# Patient Record
Sex: Female | Born: 1935 | Race: White | Hispanic: No | Smoking: Never smoker
Health system: Southern US, Community
[De-identification: ages and names within clinical notes are randomized; demographics above are authoritative.]

## PROBLEM LIST (undated history)

## (undated) DIAGNOSIS — I4891 Unspecified atrial fibrillation: Secondary | ICD-10-CM

## (undated) DIAGNOSIS — I10 Essential (primary) hypertension: Secondary | ICD-10-CM

## (undated) DIAGNOSIS — E039 Hypothyroidism, unspecified: Secondary | ICD-10-CM

## (undated) HISTORY — PX: ABDOMINAL HYSTERECTOMY: SHX81

## (undated) HISTORY — PX: CHOLECYSTECTOMY: SHX55

---

## 1997-12-14 ENCOUNTER — Other Ambulatory Visit: Admission: RE | Admit: 1997-12-14 | Discharge: 1997-12-14 | Payer: Self-pay | Admitting: Obstetrics and Gynecology

## 1998-12-23 ENCOUNTER — Other Ambulatory Visit: Admission: RE | Admit: 1998-12-23 | Discharge: 1998-12-23 | Payer: Self-pay | Admitting: Obstetrics and Gynecology

## 2000-05-14 ENCOUNTER — Other Ambulatory Visit: Admission: RE | Admit: 2000-05-14 | Discharge: 2000-05-14 | Payer: Self-pay | Admitting: Obstetrics and Gynecology

## 2009-07-01 ENCOUNTER — Ambulatory Visit: Payer: Self-pay | Admitting: Internal Medicine

## 2013-07-02 ENCOUNTER — Emergency Department: Payer: Self-pay | Admitting: Emergency Medicine

## 2013-07-02 LAB — COMPREHENSIVE METABOLIC PANEL
Albumin: 4.5 g/dL (ref 3.4–5.0)
Alkaline Phosphatase: 116 U/L
BUN: 17 mg/dL (ref 7–18)
Bilirubin,Total: 0.8 mg/dL (ref 0.2–1.0)
Creatinine: 0.76 mg/dL (ref 0.60–1.30)
EGFR (African American): >60
EGFR (Non-African Amer.): >60
Sodium: 135 mmol/L — ABNORMAL LOW (ref 136–145)
Total Protein: 8.3 g/dL — ABNORMAL HIGH (ref 6.4–8.2)

## 2013-07-02 LAB — TROPONIN I: Troponin-I: 0.03 ng/mL

## 2013-08-19 LAB — URINALYSIS, COMPLETE
Bacteria: NONE SEEN
Bilirubin,UR: NEGATIVE
GLUCOSE, UR: NEGATIVE mg/dL (ref 0–75)
Hyaline Cast: 3
Leukocyte Esterase: NEGATIVE
NITRITE: NEGATIVE
Ph: 6 (ref 4.5–8.0)
SPECIFIC GRAVITY: 1.01 (ref 1.003–1.030)
Squamous Epithelial: NONE SEEN
WBC UR: 1 /HPF (ref 0–5)

## 2013-08-19 LAB — COMPREHENSIVE METABOLIC PANEL
ALT: 35 U/L (ref 12–78)
Albumin: 3.6 g/dL (ref 3.4–5.0)
Alkaline Phosphatase: 85 U/L
Anion Gap: 9 (ref 7–16)
BILIRUBIN TOTAL: 0.9 mg/dL (ref 0.2–1.0)
BUN: 16 mg/dL (ref 7–18)
CHLORIDE: 96 mmol/L — AB (ref 98–107)
CO2: 25 mmol/L (ref 21–32)
CREATININE: 0.79 mg/dL (ref 0.60–1.30)
Calcium, Total: 8.7 mg/dL (ref 8.5–10.1)
EGFR (African American): >60
GLUCOSE: 153 mg/dL — AB (ref 65–99)
Osmolality: 265 (ref 275–301)
Potassium: 3.9 mmol/L (ref 3.5–5.1)
SGOT(AST): 60 U/L — ABNORMAL HIGH (ref 15–37)
Sodium: 130 mmol/L — ABNORMAL LOW (ref 136–145)
TOTAL PROTEIN: 6.9 g/dL (ref 6.4–8.2)

## 2013-08-19 LAB — CBC
HCT: 40.3 % (ref 35.0–47.0)
HGB: 13.7 g/dL (ref 12.0–16.0)
MCH: 30.6 pg (ref 26.0–34.0)
MCHC: 34 g/dL (ref 32.0–36.0)
MCV: 90 fL (ref 80–100)
Platelet: 125 10*3/uL — ABNORMAL LOW (ref 150–440)
RBC: 4.48 10*6/uL (ref 3.80–5.20)
RDW: 14.3 % (ref 11.5–14.5)
WBC: 7 10*3/uL (ref 3.6–11.0)

## 2013-08-19 LAB — T4, FREE: Free Thyroxine: 1.13 ng/dL (ref 0.76–1.46)

## 2013-08-19 LAB — CK: CK, Total: 1455 U/L — ABNORMAL HIGH

## 2013-08-19 LAB — LIPASE, BLOOD: LIPASE: 253 U/L (ref 73–393)

## 2013-08-19 LAB — MAGNESIUM: Magnesium: 2 mg/dL

## 2013-08-19 LAB — TROPONIN I
TROPONIN-I: 0.33 ng/mL — AB
Troponin-I: 0.95 ng/mL — ABNORMAL HIGH

## 2013-08-19 LAB — PROTIME-INR
INR: 1.1
Prothrombin Time: 14.3 secs (ref 11.5–14.7)

## 2013-08-19 LAB — CK-MB: CK-MB: 12.1 ng/mL — ABNORMAL HIGH (ref 0.5–3.6)

## 2013-08-19 LAB — CK TOTAL AND CKMB (NOT AT ARMC)
CK, TOTAL: 2143 U/L — AB
CK-MB: 15.2 ng/mL — AB (ref 0.5–3.6)

## 2013-08-20 ENCOUNTER — Inpatient Hospital Stay: Payer: Self-pay

## 2013-08-20 LAB — CBC WITH DIFFERENTIAL/PLATELET
BASOS PCT: 0.3 %
Basophil #: 0 10*3/uL (ref 0.0–0.1)
Eosinophil #: 0 10*3/uL (ref 0.0–0.7)
Eosinophil %: 0.1 %
HCT: 38.8 % (ref 35.0–47.0)
HGB: 13.4 g/dL (ref 12.0–16.0)
LYMPHS PCT: 10.8 %
Lymphocyte #: 0.6 10*3/uL — ABNORMAL LOW (ref 1.0–3.6)
MCH: 30.8 pg (ref 26.0–34.0)
MCHC: 34.5 g/dL (ref 32.0–36.0)
MCV: 89 fL (ref 80–100)
Monocyte #: 0.6 x10 3/mm (ref 0.2–0.9)
Monocyte %: 10.6 %
NEUTROS PCT: 78.2 %
Neutrophil #: 4.1 10*3/uL (ref 1.4–6.5)
PLATELETS: 131 10*3/uL — AB (ref 150–440)
RBC: 4.35 10*6/uL (ref 3.80–5.20)
RDW: 14.2 % (ref 11.5–14.5)
WBC: 5.3 10*3/uL (ref 3.6–11.0)

## 2013-08-20 LAB — LIPID PANEL
Cholesterol: 140 mg/dL (ref 0–200)
HDL: 46 mg/dL (ref 40–60)
Ldl Cholesterol, Calc: 78 mg/dL (ref 0–100)
Triglycerides: 79 mg/dL (ref 0–200)
VLDL Cholesterol, Calc: 16 mg/dL (ref 5–40)

## 2013-08-20 LAB — BASIC METABOLIC PANEL
Anion Gap: 5 — ABNORMAL LOW (ref 7–16)
BUN: 16 mg/dL (ref 7–18)
CHLORIDE: 100 mmol/L (ref 98–107)
CO2: 29 mmol/L (ref 21–32)
CREATININE: 0.88 mg/dL (ref 0.60–1.30)
Calcium, Total: 8.1 mg/dL — ABNORMAL LOW (ref 8.5–10.1)
EGFR (Non-African Amer.): 57 — ABNORMAL LOW
Glucose: 92 mg/dL (ref 65–99)
OSMOLALITY: 269 (ref 275–301)
POTASSIUM: 3.4 mmol/L — AB (ref 3.5–5.1)
Sodium: 134 mmol/L — ABNORMAL LOW (ref 136–145)

## 2013-08-20 LAB — MAGNESIUM: MAGNESIUM: 2 mg/dL

## 2013-08-20 LAB — HEMOGLOBIN A1C: Hemoglobin A1C: 5.9 % (ref 4.2–6.3)

## 2013-08-20 LAB — TROPONIN I
Troponin-I: 1.1 ng/mL — ABNORMAL HIGH
Troponin-I: 0.88 ng/mL — ABNORMAL HIGH

## 2013-08-20 LAB — CK: CK, TOTAL: 2249 U/L — AB

## 2013-08-20 LAB — CK TOTAL AND CKMB (NOT AT ARMC)
CK, Total: 2241 U/L — ABNORMAL HIGH
CK, Total: 2383 U/L — ABNORMAL HIGH
CK-MB: 12.9 ng/mL — AB (ref 0.5–3.6)
CK-MB: 9.2 ng/mL — ABNORMAL HIGH (ref 0.5–3.6)

## 2013-08-20 LAB — TSH: THYROID STIMULATING HORM: 2.02 u[IU]/mL

## 2013-08-21 ENCOUNTER — Encounter: Payer: Self-pay | Admitting: Internal Medicine

## 2013-08-21 LAB — BASIC METABOLIC PANEL
ANION GAP: 4 — AB (ref 7–16)
BUN: 13 mg/dL (ref 7–18)
CHLORIDE: 103 mmol/L (ref 98–107)
Calcium, Total: 8.6 mg/dL (ref 8.5–10.1)
Co2: 27 mmol/L (ref 21–32)
Creatinine: 0.86 mg/dL (ref 0.60–1.30)
GFR CALC NON AF AMER: 59 — AB
Glucose: 87 mg/dL (ref 65–99)
Osmolality: 268 (ref 275–301)
Potassium: 3.4 mmol/L — ABNORMAL LOW (ref 3.5–5.1)
Sodium: 134 mmol/L — ABNORMAL LOW (ref 136–145)

## 2013-08-21 LAB — CK: CK, Total: 2220 U/L — ABNORMAL HIGH

## 2013-08-25 LAB — URINALYSIS, COMPLETE
BLOOD: NEGATIVE
Bilirubin,UR: NEGATIVE
GLUCOSE, UR: NEGATIVE mg/dL (ref 0–75)
Ketone: NEGATIVE
Nitrite: NEGATIVE
PROTEIN: NEGATIVE
Ph: 7 (ref 4.5–8.0)
RBC,UR: <1 /HPF (ref 0–5)
SPECIFIC GRAVITY: 1.002 (ref 1.003–1.030)
Squamous Epithelial: <1

## 2013-08-27 LAB — URINE CULTURE

## 2013-08-31 ENCOUNTER — Encounter: Payer: Self-pay | Admitting: Internal Medicine

## 2013-10-09 ENCOUNTER — Inpatient Hospital Stay: Payer: Self-pay

## 2013-10-09 LAB — TROPONIN I
TROPONIN-I: 0.07 ng/mL — AB
Troponin-I: 0.06 ng/mL — ABNORMAL HIGH

## 2013-10-09 LAB — BASIC METABOLIC PANEL
Anion Gap: 6 — ABNORMAL LOW (ref 7–16)
BUN: 23 mg/dL — ABNORMAL HIGH (ref 7–18)
CALCIUM: 8.8 mg/dL (ref 8.5–10.1)
CHLORIDE: 103 mmol/L (ref 98–107)
Co2: 27 mmol/L (ref 21–32)
Creatinine: 0.94 mg/dL (ref 0.60–1.30)
EGFR (African American): >60
EGFR (Non-African Amer.): 53 — ABNORMAL LOW
GLUCOSE: 101 mg/dL — AB (ref 65–99)
Osmolality: 276 (ref 275–301)
Potassium: 4.3 mmol/L (ref 3.5–5.1)
SODIUM: 136 mmol/L (ref 136–145)

## 2013-10-09 LAB — CBC
HCT: 42.3 % (ref 35.0–47.0)
HGB: 13.8 g/dL (ref 12.0–16.0)
MCH: 29.3 pg (ref 26.0–34.0)
MCHC: 32.7 g/dL (ref 32.0–36.0)
MCV: 90 fL (ref 80–100)
Platelet: 197 10*3/uL (ref 150–440)
RBC: 4.72 10*6/uL (ref 3.80–5.20)
RDW: 14.5 % (ref 11.5–14.5)
WBC: 4.7 10*3/uL (ref 3.6–11.0)

## 2013-10-09 LAB — PRO B NATRIURETIC PEPTIDE: B-TYPE NATIURETIC PEPTID: 5108 pg/mL — AB (ref 0–450)

## 2013-10-10 LAB — TSH: Thyroid Stimulating Horm: 4.99 u[IU]/mL — ABNORMAL HIGH

## 2013-10-10 LAB — CBC WITH DIFFERENTIAL/PLATELET
BASOS PCT: 0.4 %
Basophil #: 0 10*3/uL (ref 0.0–0.1)
EOS PCT: 1 %
Eosinophil #: 0 10*3/uL (ref 0.0–0.7)
HCT: 40.4 % (ref 35.0–47.0)
HGB: 12.9 g/dL (ref 12.0–16.0)
Lymphocyte #: 1.1 10*3/uL (ref 1.0–3.6)
Lymphocyte %: 21.4 %
MCH: 28.8 pg (ref 26.0–34.0)
MCHC: 31.9 g/dL — AB (ref 32.0–36.0)
MCV: 90 fL (ref 80–100)
MONO ABS: 0.5 x10 3/mm (ref 0.2–0.9)
MONOS PCT: 9.8 %
NEUTROS ABS: 3.4 10*3/uL (ref 1.4–6.5)
NEUTROS PCT: 67.4 %
Platelet: 177 10*3/uL (ref 150–440)
RBC: 4.48 10*6/uL (ref 3.80–5.20)
RDW: 14 % (ref 11.5–14.5)
WBC: 5 10*3/uL (ref 3.6–11.0)

## 2013-10-10 LAB — BASIC METABOLIC PANEL
Anion Gap: 6 — ABNORMAL LOW (ref 7–16)
BUN: 20 mg/dL — AB (ref 7–18)
Calcium, Total: 8.3 mg/dL — ABNORMAL LOW (ref 8.5–10.1)
Chloride: 108 mmol/L — ABNORMAL HIGH (ref 98–107)
Co2: 27 mmol/L (ref 21–32)
Creatinine: 0.73 mg/dL (ref 0.60–1.30)
EGFR (Non-African Amer.): >60
GLUCOSE: 83 mg/dL (ref 65–99)
Osmolality: 283 (ref 275–301)
Potassium: 3.8 mmol/L (ref 3.5–5.1)
SODIUM: 141 mmol/L (ref 136–145)

## 2013-10-10 LAB — TROPONIN I: Troponin-I: 0.06 ng/mL — ABNORMAL HIGH

## 2013-11-21 ENCOUNTER — Emergency Department: Payer: Self-pay | Admitting: Emergency Medicine

## 2013-11-21 LAB — BASIC METABOLIC PANEL
ANION GAP: 12 (ref 7–16)
BUN: 18 mg/dL (ref 7–18)
CALCIUM: 8.9 mg/dL (ref 8.5–10.1)
CHLORIDE: 101 mmol/L (ref 98–107)
CREATININE: 1.05 mg/dL (ref 0.60–1.30)
Co2: 24 mmol/L (ref 21–32)
GFR CALC AF AMER: 53 — AB
GFR CALC NON AF AMER: 46 — AB
Glucose: 225 mg/dL — ABNORMAL HIGH (ref 65–99)
Osmolality: 283 (ref 275–301)
Potassium: 3.9 mmol/L (ref 3.5–5.1)
Sodium: 137 mmol/L (ref 136–145)

## 2013-11-21 LAB — CBC
HCT: 42.6 % (ref 35.0–47.0)
HGB: 14.1 g/dL (ref 12.0–16.0)
MCH: 29.7 pg (ref 26.0–34.0)
MCHC: 33 g/dL (ref 32.0–36.0)
MCV: 90 fL (ref 80–100)
Platelet: 200 10*3/uL (ref 150–440)
RBC: 4.73 10*6/uL (ref 3.80–5.20)
RDW: 13.7 % (ref 11.5–14.5)
WBC: 5.5 10*3/uL (ref 3.6–11.0)

## 2013-11-21 LAB — TROPONIN I: TROPONIN-I: 0.03 ng/mL

## 2014-10-24 NOTE — Consult Note (Signed)
PATIENT NAME:  Zoe Hughes, Zoe Hughes MR#:  [id number] DATE OF BIRTH:  April 01, 1919  DATE OF CONSULTATION:  08/20/2013  CONSULTING PHYSICIAN:  Marcina MillardAlexander Jamaine Quintin, MD  PRIMARY CARE PHYSICIAN: Dr. Sampson GoonFitzgerald.   CHIEF COMPLAINT: "I fell."   REASON FOR CONSULTATION: Consultation requested for evaluation of borderline elevated troponin and atrial fibrillation.   HISTORY OF PRESENT ILLNESS: The patient is a 81 year old female with history of chronic atrial fibrillation and hypertension. The patient apparently was in her usual state of health. Denies chest pain or shortness of breath. The patient ate a light breakfast. She was trying to fix lunch when she fell to the floor and experienced generalized weakness. The patient reports that she was on the floor for three hours struggling to get on her feet and finally called 911. The patient was [facility] where she was noted to be in atrial fibrillation with a rapid ventricular rate in the 120s to 140s treated with intravenous diltiazem. The patient was admitted to telemetry, where admission labs were notable for a borderline elevated troponin of 0.95. Of note, total CK and MB fraction were 1455 and 12.1, respectively. Today, the patient reports doing well. Denies chest pain or shortness of breath.   PAST MEDICAL HISTORY: 1. Atrial fibrillation.  2. Hypertension.  3. Hypothyroidism.   MEDICATIONS: Diltiazem 180 mg daily, Synthroid 50 mcg daily.   SOCIAL HISTORY: The patient currently lives alone. She denies tobacco or EtOH abuse.   FAMILY HISTORY: Mother status post CVA.   REVIEW OF SYSTEMS.  CONSTITUTIONAL: No fever or chills.  EYES: No blurry vision.   EARS: No hearing loss.  RESPIRATORY: No shortness of breath.  CARDIOVASCULAR: No chest pain.  GASTROINTESTINAL: No nausea, vomiting, the patient has had some chronic diarrhea.  GENITOURINARY: No dysuria or hematuria.  ENDOCRINE: No polyuria or polydipsia.  MUSCULOSKELETAL: No  arthralgias or myalgias.  NEUROLOGICAL: No focal muscle weakness or numbness.  PSYCHOLOGICAL: No depression or anxiety.   PHYSICAL EXAMINATION: VITAL SIGNS: Blood pressure 118/57, pulse 79, respirations 18, temperature 98.5, pulse oximetry 89%.  HEENT: Pupils equal, reactive to light and accommodation.  NECK: Supple without thyromegaly.  LUNGS: Clear.  HEART: Normal JVP. Normal PMI. Regular rate and rhythm. Normal S1, S2. No appreciable gallop, murmur, or rub.  ABDOMEN: Soft and nontender. Pulses were intact bilaterally.  MUSCULOSKELETAL: Normal muscle tone.  NEUROLOGIC: The patient is alert and oriented x 3. Motor and sensory both grossly intact.   IMPRESSION: A 81 year old female who fell, struggled to get on her feet for over three hours, has borderline elevated troponin very likely due to demand supply ischemia, especially in light of atrial fibrillation with rapid ventricular rate, also exacerbated by the patient's clinical presentation as described above. The patient currently appears clinically stable. Heart rate under good control. The patient denies chest pain.   RECOMMENDATIONS: 1. Agree with overall current therapy.  2. Defer full dose anticoagulation.  3. Would certainly defer chronic anticoagulation for atrial fibrillation despite CHADS2 score of 2 in light of the patient's recent fall.  4. Defer further noninvasive or invasive cardiac evaluation at this time.  ____________________________ Marcina MillardAlexander Tiffeny Minchew, MD ap:sg D: 08/20/2013 09:44:51 ET T: 08/20/2013 10:31:17 ET JOB#: [id number]  cc: Marcina MillardAlexander Aixa Corsello, MD, <Dictator> Marcina MillardALEXANDER Chad Donoghue MD ELECTRONICALLY SIGNED 09/05/2013 13:15

## 2014-10-24 NOTE — Discharge Summary (Signed)
PATIENT NAME:  Zoe Hughes, Zoe Hughes MR#:  [id number] DATE OF BIRTH:  [birth year]  DATE OF ADMISSION:  10/09/2013 DATE OF DISCHARGE:  10/11/2013  DISCHARGE DIAGNOSES: 1.  Atrial fibrillation with rapid ventricular response.  2.  Bronchitis.   HISTORY OF PRESENT ILLNESS:  Please see initial history and physical.  Briefly, this is a 81 year old female with history of known A. Fib.  She came in with a cough as well as tachycardia.  She was sent in because at home her heart rate was in the 150s.  The patient denied any symptoms.  The patient was found to be in A. Fib with rapid ventricular response.   HOSPITAL COURSE BY ISSUE:  1.  A. Fib with RVR.  She converted with IV diltiazem.  She was already on Cardizem 120, but we have increased that to 180.  Heart rates remained stable, in normal sinus rhythm.  She was seen by Dr. Gwen PoundsKowalski.  She will be discharged off anticoagulation given fall risk.  2.  Bronchitis.  She will finish off the levofloxacin that was started at home.   HOSPITAL FOLLOW-UP:  The patient will follow up with Dr. Sampson GoonFitzgerald in 1 to 2 weeks.   DISCHARGE MEDICATIONS:  Please see [facility] discharge instructions.  Briefly, she will swap out her Cardizem 120 to Cardizem 180.   DISCHARGE DIET:  Low sodium, regular consistency.   DISCHARGE CODE STATUS:  THE PATIENT IS DO NOT RESUSCITATE.   TIME SPENT:  This discharge took 35 minutes.    ____________________________ Stann Mainlandavid P. Sampson GoonFitzgerald, MD dpf:ea D: 10/11/2013 12:01:02 ET T: 10/11/2013 16:02:31 ET JOB#: [id number]  cc: Stann Mainlandavid P. Sampson GoonFitzgerald, MD, <Dictator> Jasani Lengel Sampson GoonFITZGERALD MD ELECTRONICALLY SIGNED 10/14/2013 22:15

## 2014-10-24 NOTE — Discharge Summary (Signed)
PATIENT NAME:  Zoe Hughes, Zoe Hughes MR#:  [id number] DATE OF BIRTH:  January 08, 1919  DATE OF ADMISSION:  08/20/2013 DATE OF DISCHARGE:  08/21/2013  DISCHARGE DIAGNOSES: 1.  Rhabdomyolysis due to fall.  2.  Atrial fib with rapid ventricular response.  3.  Positive troponins, likely supply/demand ischemia.   HISTORY OF PRESENT ILLNESS: This is a very pleasant 81 year old female who lives alone. She ended up having a fall at home and was on the ground for several hours. She was scooting along the floor to try to get to the phone. She was able to call 911 after 3 hours. She was brought to the ER where she was found to be in Afib with RVR. She also had elevated CPK consistent with rhabdo.   HOSPITAL COURSE:  By issue: 1.  Atrial fib with rapid ventricular response. She was given IV dilt in the ER and oral diltiazem and came under rate control. She was maintained on dilt 60 q.6 throughout the hospitalization. On the day of discharge, her heart rate was again noted to be increased. She was increased from 240 to 300 and will be discharged on that dose.  2. Rhabdo CK peaked at 2383. Treated with IV fluids and renal function remained stable. On the day of discharge, creatinine was 0.86. Potassium level was normal.    3.  Diarrhea. She had several episodes of diarrhea, but this resolved. No C. diff was checked as she could no longer provide a sample.  4.  Positive troponins. The patient was seen by Dr. Darrold JunkerParaschos, who felt this was likely supply/demand ischemia with the Afib with RVR. He did not recommend any further cardiac workup. He did not recommend anticoagulation for the Afib given the falls risk.   DISCHARGE MEDICATIONS: Please see [facility] discharge summary; however, she will continue:  1.  Synthroid 50.  2.  Diltiazem 300 once a day.  3.  Acetaminophen 325 q.4h. p.r.n. pain or fever.  4.  Simvastatin 10 mg once a day.   DISCHARGE DISPOSITION: She will be discharged to a skilled nursing facility.   DISCHARGE  DIET: Low sodium, regular consistency.   ACTIVITY: As tolerated.   FOLLOWUP: The patient will follow up with Dr. Sampson GoonFitzgerald or Dr. Dareen PianoAnderson, [facility], within 1 to 2 weeks. She will need a followup renal function panel.   This discharge took 35 minutes.   ____________________________ Stann Mainlandavid P. Sampson GoonFitzgerald, MD dpf:dmm D: 08/21/2013 09:39:49 ET T: 08/21/2013 10:00:49 ET JOB#: [id number]  cc: Stann Mainlandavid P. Sampson GoonFitzgerald, MD, <Dictator> Tait Balistreri Sampson GoonFITZGERALD MD ELECTRONICALLY SIGNED 08/27/2013 23:23

## 2014-10-24 NOTE — H&P (Signed)
PATIENT NAME:  Zoe Hughes, Zoe Hughes MR#:  [id number] DATE OF BIRTH:  10/15/1918  DATE OF ADMISSION:  08/19/2013  PRIMARY CARE PHYSICIAN: Stann Mainlandavid P. Sampson GoonFitzgerald, MD, at [facility].   REFERRING PHYSICIAN: Eartha InchCory R. York CeriseForbach, MD  CHIEF COMPLAINT: Status post fall.   HISTORY OF PRESENT ILLNESS: The patient is a pleasant 81 year old female with a history of chronic A. fib, hypothyroidism, hypertension, who had a fall about 2:00 a.m. Apparently, she was trying to fix lunch, turned around to sit down and had a fall without loss of consciousness. She fell on her tailbone and states that she felt weak and was on the floor crawling to go to a phone but could not and it took her 3 hours and finally did call EMS, who brought her here. Here, she was noted to have a positive troponin of 0.33 without any chest pain and CK-MB of 12. She was also noted to have A. fib with RVR with rates of 120s to 140s and received diltiazem IV 15 mg x 1 as well as 120 mg p.o. The rates are now currently in the 90s to low 100s and hospitalist services were contacted for further evaluation and management.   PAST MEDICAL HISTORY: A. fib, hypothyroidism, hypertension.   PAST SURGICAL HISTORY: Cholecystectomy and hysterectomy. Neck surgery for tumor removal and rectocele surgery.   ALLERGIES: ASPIRIN CAUSING BLEEDING AND ALLERGIC TO TETANUS SHOT.   OUTPATIENT MEDICATIONS: Diltiazem 180 mg extended-release daily and Synthroid 50 mcg daily.   FAMILY HISTORY: Mom stroke, dad's side diabetes.   SOCIAL HISTORY: No tobacco, alcohol or drug use. Lives by herself. Does her  own ADLs.    REVIEW OF SYSTEMS: CONSTITUTIONAL: No fever, fatigue or weakness or weight gain. The patient has about 5 to 6-pound weight loss in the last 6 months.  EYES: No blurry vision or double vision.  ENT:  No tinnitus or hearing loss.  RESPIRATORY: No cough, wheezing, shortness of breath or dyspnea on exertion.  CARDIOVASCULAR: No chest pain or palpitations. No  swelling in the legs CHF.  Has hypertension and A. fib.  GASTROINTESTINAL: No nausea, vomiting. Has bouts of diarrhea and constipation over the last couple of months and being treated by PCP for that. The patient had diarrhea over 2 days in the last week, none today. No bloody stools or dark stools.  GENITOURINARY:  Denies dysuria or hematuria.  HEMATOLOGY AND LYMPHATIC:  No anemia or easy bruising.  SKIN: No rashes.  MUSCULOSKELETAL: Has some pain in the tailbone area.  NEUROLOGIC: No focal weakness or numbness.  PSYCHIATRIC: No anxiety or depression.   PHYSICAL EXAMINATION: VITAL SIGNS: Temperature 98.2, pulse rate 88, respiratory rate 20, blood pressure 147/60, O2 sat 95% on room air.  GENERAL: The patient is a well-developed female lying in bed, no obvious distress.  HEENT: Normocephalic, atraumatic. Pupils are equal and reactive. Anicteric sclerae. Extraocular muscles intact. Moist mucous membranes.  NECK: Supple. No thyroid tenderness. No cervical lymphadenopathy.  CARDIOVASCULAR: S1, S2, irregularly irregular. No significant murmurs appreciated.  LUNGS: Clear to auscultation without wheezing, rhonchi or rales.  ABDOMEN: Soft, nontender, nondistended. Positive bowel sounds in all quadrants.  EXTREMITIES: No pitting edema.  NEUROLOGIC: Cranial nerves II through XII grossly intact. Strength is 5 out of 5 in all extremities. Sensation is intact to light touch.  PSYCHIATRIC: Awake, alert and oriented x 3, cooperative.   LABORATORY, DIAGNOSTIC AND RADIOLOGICAL DATA:  Sodium 130, potassium 3.9, serum CO2 25. LFTs show AST of 60, otherwise within normal limits.  CK-MB of 12.1, CK total is pending. Troponin 0.33.  Free thyroxine is 1.13. White count is 7, hemoglobin 13.7, platelets 125. UA not suggestive of infection. EKG shows A. fib with RVR, rate of 102, no previous EKG here. There is no acute ST elevations or depressions, perhaps a little bit of dynamic T wave changes in V4, V5. X-ray of the  chest no acute abnormality. X-ray of the left hip, complete, showing no evidence for a left hip fracture.   ASSESSMENT AND PLAN: We have a 81 year old female with chronic atrial fibrillation, hypertension, hypothyroidism, status post fall and was on the floor for 3  hours who comes in with atrial fibrillation with rapid ventricular response and positive troponin. The patient has no active pains in the chest. The patient also had atrial fibrillation with rapid ventricular response now better with IV diltiazem in addition to some p.o. diltiazem. Would admit the patient to telemetry for observation and start the patient on 60 mg every 6 hours of p.o. diltiazem. In regards to positive troponin, she has no active chest pains. It is possible that this is non-ST elevation myocardial infarction but it is also possible that this is more of a demand ischemia secondary to atrial fibrillation with rapid ventricular response. We have no previous EKGs to compare this with. Would cycle the troponins.  The patient cannot tolerate aspirin and is not on  any blood thinners or antiplatelets for the atrial fibrillation. Would start a statin, obtain an echocardiogram and obtain cardiology consult for tomorrow. Will start the patient on some gentle fluids and control the heart rate. Would wait for the CK total as the patient might have a little bit of a rhabdomyolysis as well. The patient has received some fluids in the ER. Would start on heparin for deep vein thrombosis prophylaxis and check a TSH and resume Synthroid. CODE STATUS: The patient is DO NOT RESUSCITATE per her wishes.   TOTAL TIME SPENT: Is 45 minutes.    ____________________________ Krystal Eaton, MD sa:cs D: 08/19/2013 20:21:43 ET T: 08/19/2013 20:36:11 ET JOB#: [id number]  cc: Krystal Eaton, MD, <Dictator> Stann Mainland. Sampson Goon, MD Marcelle Smiling [facility] MD ELECTRONICALLY SIGNED 09/05/2013 13:08

## 2014-10-24 NOTE — H&P (Signed)
PATIENT NAME:  Zoe Hughes, Zoe Hughes MR#:  [id number] DATE OF BIRTH:  [birth year]  DATE OF ADMISSION:  10/09/2013  PRIMARY CARE PHYSICIAN:  Dr. Sampson Goon.  CHIEF COMPLAINT: Told to come in for fast heart rate.   HISTORY OF PRESENT ILLNESS: This is a 81 year old female who stated that her physical therapist came out to see her and noted her heart rate was fast. The patient called up Dr. Sampson Goon this morning secondary to her coughing up some bloody mucus. She stopped taking her nose drops for her nosebleed. Dr. Sampson Goon called in some Levaquin and she took her first dose today. Her physical therapist came back to the house today and noticed her heart rate was up, and she was told to come in by Dr. Jarrett Ables nurse to get evaluated for the fast heart rate. In the ER, her heart rate has been in the 150s. Initially looked like SVT, but now atrial fibrillation. Her blood pressure has been good. The patient has a history of atrial fibrillation in the past and is on Cardizem CD 120 mg daily. The patient feels okay. Does not complain of any chest pain or shortness of breath or even palpitations.   PAST MEDICAL HISTORY: Atrial fibrillation, hypothyroidism, hypertension, glaucoma, arthritis.   PAST SURGICAL HISTORY: Cholecystectomy, hysterectomy, bladder tuck, cataracts, neck surgery and rectocele surgery as per old chart.   ALLERGIES: ASPIRIN CAUSES BLEEDING. ALLERGIC TO TETANUS SHOT.   FAMILY HISTORY: Father died of lung cancer. Mother died of a CVA.   SOCIAL HISTORY: Currently lives at [facility] assisted living. No smoking. No alcohol. No drug use. Worked in the [employer].   MEDICATIONS: Include diltiazem extended-release 120 mg daily, Levaquin 500 mg daily  starting today, Synthroid 50 mcg daily.   REVIEW OF SYSTEMS:  CONSTITUTIONAL: Positive for hot feeling. No fever or chills. Positive for weight loss from 117 down to 105. No weakness or fatigue.  EYES: She did have glaucoma and also cataract  in the past.  EARS, NOSE, MOUTH AND THROAT: Decreased hearing. Positive for bloody nose. Positive for runny nose.  CARDIOVASCULAR: No chest pain or palpitations.  RESPIRATORY: No shortness of breath. Positive for coughing. No real phlegm.  GASTROINTESTINAL: No nausea. No vomiting. No abdominal pain, alternating diarrhea and constipation. No bright red blood per rectum. No melena.  GENITOURINARY: No burning on urination. No hematuria.  MUSCULOSKELETAL: Positive for low back pain.  INTEGUMENT: No rashes or eruptions.  NEUROLOGIC: No fainting or blackouts.  PSYCHIATRIC: No anxiety or depression.  ENDOCRINE: Positive for hypothyroidism.  HEMATOLOGIC AND LYMPHATIC: No anemia and no easy bruising or bleeding.   PHYSICAL EXAMINATION: VITAL SIGNS: Temperature 98, pulse 154, respirations 20, blood pressure 131/99, pulse ox 100% on room air.  GENERAL: No respiratory distress.  EYES: Conjunctivae and lids normal. Pupils equal, round, and reactive to light. Extraocular muscles intact. No nystagmus.  EARS, NOSE, MOUTH AND THROAT: Tympanic membranes: No erythema. Nasal mucosa: Slight erythema. No exudate seen. Lips and gums: No lesions. Throat:  No erythema.  NECK: No JVD. No bruits. No thyromegaly. No thyroid nodules palpated.  RESPIRATORY: Lungs: Clear to auscultation. No use of accessory muscles to breathe. No rhonchi, rales or wheeze heard.  CARDIOVASCULAR: S1, S2, irregularly irregular, tachycardic, 2/6 systolic ejection murmur. Carotid upstroke 2+ bilaterally. No bruits. Dorsalis pedis pulses 2+ bilaterally. No edema of the lower extremity.  ABDOMEN: Soft, nontender. No organomegaly/splenomegaly. Normoactive bowel sounds. No masses felt.  LYMPHATIC: No lymph nodes in the neck.  MUSCULOSKELETAL: No clubbing, edema  or cyanosis.  SKIN: No rashes or lesions seen.  NEUROLOGIC: Cranial nerves II through XII grossly intact. Deep tendon reflexes 2+ bilateral lower extremities.  PSYCHIATRIC: The patient is  oriented to person, place and time.   LABORATORY AND RADIOLOGICAL DATA: Previous EKG, 08/20/2013, showed an EF of 60% to 65%, LVH, mild mitral valve regurgitation. Labs for this hospital stay: Chest x-ray showed stable cardiomegaly. No active acute cardiopulmonary disease. Troponin borderline at 0.06. White blood cell count 4.7, H and H 13.8 and 42.3, platelet count of 197. Glucose 101, BUN 23, creatinine 0.94, sodium 136, potassium 4.3, chloride 103, CO2 of 27, calcium 8.8. BNP 5108.   ASSESSMENT AND PLAN: 1.  Rapid atrial fibrillation. Will give gentle IV fluid hydration. The patient is on Cardizem drip 15 mg per hour. We will also add IV metoprolol 5 mg IV q.1 hour for fast heart rate. We will start oral metoprolol 25 mg b.i.d. with the first dose stat. Since the patient recently had an echocardiogram, will not repeat. We will get a cardiology consultation in the a.m.  We will give gentle IV fluid hydration since the patient has an elevated BUN. The patient has no signs of congestive heart failure at this time, even though BNP is elevated. Chest x-ray is clear and lungs are clear.  2.  Elevated troponin, likely secondary to fast heart rate. THE PATIENT HAS AN ALLERGY TO ASPIRIN.  3.   Sinusitis, recently started on Levaquin by Dr. Sampson GoonFitzgerald, will continue.  4.  Hypothyroidism. Continue levothyroxine and check a TSH in the a.m.   TIME SPENT ON ADMISSION: 55 minutes.   I still will clarify code status on this patient. She needed to go to the bathroom prior to me finishing my talking with her.    ____________________________ Herschell Dimesichard J. Renae GlossWieting, MD rjw:dmm D: 10/09/2013 19:01:00 ET T: 10/09/2013 19:14:42 ET JOB#: [id number]  cc: Onalee Huaavid P. Sampson GoonFitzgerald, MD Herschell Dimesichard J. Renae GlossWieting, MD, <Dictator> Salley ScarletICHARD J Carlyn Lemke MD ELECTRONICALLY SIGNED 10/12/2013 19:17

## 2014-10-24 NOTE — Consult Note (Signed)
PATIENT NAME:  Zoe BillingsCLARK, Deneshia B MR#:  [id number] DATE OF BIRTH:  105/30/1920  DATE OF CONSULTATION:  10/10/2013  REFERRING PHYSICIAN:  Ramonita LabAruna Gouru, MD CONSULTING PHYSICIAN:  Lamar BlinksBruce J. Alyshia Kernan, MD  REASON FOR CONSULTATION: Atrial fibrillation with rapid ventricular rate, rheumatic fever and hypertension.   CHIEF COMPLAINT: "I was short of breath."   HISTORY OF PRESENT ILLNESS: This is a 81 year old female with known falling who fell 4 weeks prior for unknown reasons, but did not have any evidence of causes or syncope. The patient has had some hypertension, on appropriate medication management, and has had a recent bronchitis. Wit this bronchitis, the patient has had some new onset atrial fibrillation with rapid ventricular rate and now converted to normal sinus rhythm on appropriate medication management. The patient has had no evidence of bleeding complication, but does have pre-ventricular and pre-atrial contractions by EKG today. The patient does have a little elevation of troponin of 0.07 consistent with demand ischemia.   REVIEW OF SYSTEMS: The remainder review of systems is negative for vision change, ringing in the ears, hearing loss, cough, congestion, heartburn, nausea, vomiting, diarrhea, bloody stool, stomach pain, extremity pain, leg weakness, cramping in buttocks, known blood clots, headaches, blackouts, dizzy spells, nosebleeds, frequent urination, urination at night, muscle weakness, numbness, anxiety, depression, skin lesions or skin rashes.   PAST MEDICAL HISTORY: 1.  Hypertension.  2.  Preventricular contractions.  3.  Rheumatic fever.  FAMILY HISTORY: No family members with early onset of cardiovascular disease or hypertension.   SOCIAL HISTORY: Currently denies alcohol or tobacco use.   ALLERGIES: As listed.   MEDICATIONS: As listed.   PHYSICAL EXAMINATION: VITAL SIGNS: Blood pressure is 122/68 bilaterally and heart rate 70 upright and reclining and irregular.  GENERAL:  She is a well-appearing female in no acute distress.  HEENT: No icterus, thyromegaly, ulcers, hemorrhage, or xanthelasma.  CARDIOVASCULAR: Irregularly irregular with normal S1 and S2. A 2/6 apical murmur consistent with mitral regurgitation. PMI is diffuse. Carotid upstroke normal without bruit. Jugular venous pressure is normal.  LUNGS: Few basilar crackles and few expiratory wheezes.  ABDOMEN: Soft and nontender without hepatosplenomegaly or masses. Abdominal aorta is normal size without bruit.  EXTREMITIES: 2+ radial, femoral, and dorsal pedal pulses with no lower extremity edema, cyanosis, clubbing or ulcers.  NEUROLOGIC: She is oriented to time, place, and person with normal mood and affect.  ASSESSMENT: A 81 year old female with hypertension and bronchitis type symptoms with new onset atrial fibrillation with rapid ventricular rate now converted to normal rhythm with preventricular contractions and no current evidence of myocardial infarction and/or congestive heart failure.   RECOMMENDATIONS: 1.  Continue heart rate control with goal heart rate between 60 and 90 beats per minute.  2.  Consideration of anticoagulation, although would use aspirin at this time due to advanced age and isolated episode of atrial fibrillation to reduce bleeding complications and concerns with the patient recently having lots of fall risk.  3.  Echocardiogram for history of rheumatic fever, causes of atrial fibrillation. 4.  Ambulation and follow for further needs in adjustments of medication management.   ____________________________ Lamar BlinksBruce J. Demontez Novack, MD bjk:sb D: 10/10/2013 08:36:43 ET T: 10/10/2013 08:45:17 ET JOB#: [id number]  cc: Lamar BlinksBruce J. Aalayah Riles, MD, <Dictator> Lamar BlinksBRUCE J Ariday Brinker MD ELECTRONICALLY SIGNED 10/14/2013 7:49

## 2015-08-22 ENCOUNTER — Inpatient Hospital Stay
Admission: EM | Admit: 2015-08-22 | Discharge: 2015-08-25 | DRG: 640 | Disposition: A | Discharge status: Skilled Nursing Facility | Payer: Medicare Other | Attending: Internal Medicine | Admitting: Internal Medicine

## 2015-08-22 ENCOUNTER — Encounter: Payer: Self-pay | Admitting: Emergency Medicine

## 2015-08-22 DIAGNOSIS — E871 Hypo-osmolality and hyponatremia: Principal | ICD-10-CM | POA: Diagnosis present

## 2015-08-22 DIAGNOSIS — Z9049 Acquired absence of other specified parts of digestive tract: Secondary | ICD-10-CM | POA: Diagnosis not present

## 2015-08-22 DIAGNOSIS — I482 Chronic atrial fibrillation: Secondary | ICD-10-CM | POA: Diagnosis present

## 2015-08-22 DIAGNOSIS — Z887 Allergy status to serum and vaccine status: Secondary | ICD-10-CM | POA: Diagnosis not present

## 2015-08-22 DIAGNOSIS — R0602 Shortness of breath: Secondary | ICD-10-CM

## 2015-08-22 DIAGNOSIS — Z79899 Other long term (current) drug therapy: Secondary | ICD-10-CM

## 2015-08-22 DIAGNOSIS — Z888 Allergy status to other drugs, medicaments and biological substances status: Secondary | ICD-10-CM

## 2015-08-22 DIAGNOSIS — Z90710 Acquired absence of both cervix and uterus: Secondary | ICD-10-CM

## 2015-08-22 DIAGNOSIS — E860 Dehydration: Secondary | ICD-10-CM | POA: Diagnosis present

## 2015-08-22 DIAGNOSIS — J45909 Unspecified asthma, uncomplicated: Secondary | ICD-10-CM | POA: Diagnosis present

## 2015-08-22 DIAGNOSIS — J189 Pneumonia, unspecified organism: Secondary | ICD-10-CM | POA: Diagnosis present

## 2015-08-22 DIAGNOSIS — R079 Chest pain, unspecified: Secondary | ICD-10-CM | POA: Diagnosis not present

## 2015-08-22 DIAGNOSIS — E039 Hypothyroidism, unspecified: Secondary | ICD-10-CM | POA: Diagnosis present

## 2015-08-22 DIAGNOSIS — Z7982 Long term (current) use of aspirin: Secondary | ICD-10-CM | POA: Diagnosis not present

## 2015-08-22 HISTORY — DX: Unspecified atrial fibrillation: I48.91

## 2015-08-22 HISTORY — DX: Hypothyroidism, unspecified: E03.9

## 2015-08-22 LAB — COMPREHENSIVE METABOLIC PANEL
ALK PHOS: 131 U/L — AB (ref 38–126)
ALT: 162 U/L — AB (ref 14–54)
AST: 110 U/L — ABNORMAL HIGH (ref 15–41)
Albumin: 4.1 g/dL (ref 3.5–5.0)
Anion gap: 12 (ref 5–15)
BILIRUBIN TOTAL: 1.2 mg/dL (ref 0.3–1.2)
BUN: 19 mg/dL (ref 6–20)
CALCIUM: 8.5 mg/dL — AB (ref 8.9–10.3)
CO2: 22 mmol/L (ref 22–32)
CREATININE: 0.61 mg/dL (ref 0.44–1.00)
Chloride: 86 mmol/L — ABNORMAL LOW (ref 101–111)
Glucose, Bld: 141 mg/dL — ABNORMAL HIGH (ref 65–99)
Potassium: 3.9 mmol/L (ref 3.5–5.1)
SODIUM: 120 mmol/L — AB (ref 135–145)
TOTAL PROTEIN: 7 g/dL (ref 6.5–8.1)

## 2015-08-22 LAB — URINALYSIS COMPLETE WITH MICROSCOPIC (ARMC ONLY)
BACTERIA UA: NONE SEEN
BILIRUBIN URINE: NEGATIVE
HGB URINE DIPSTICK: NEGATIVE
KETONES UR: NEGATIVE mg/dL
Leukocytes, UA: NEGATIVE
NITRITE: NEGATIVE
RBC / HPF: NONE SEEN RBC/hpf (ref 0–5)
Specific Gravity, Urine: 1.015 (ref 1.005–1.030)
Squamous Epithelial / LPF: NONE SEEN
WBC, UA: NONE SEEN WBC/hpf (ref 0–5)
pH: 5 (ref 5.0–8.0)

## 2015-08-22 LAB — CBC
HEMATOCRIT: 41.3 % (ref 35.0–47.0)
Hemoglobin: 14.2 g/dL (ref 12.0–16.0)
MCH: 29.5 pg (ref 26.0–34.0)
MCHC: 34.3 g/dL (ref 32.0–36.0)
MCV: 86 fL (ref 80.0–100.0)
PLATELETS: 183 10*3/uL (ref 150–440)
RBC: 4.8 MIL/uL (ref 3.80–5.20)
RDW: 13.7 % (ref 11.5–14.5)
WBC: 9.1 10*3/uL (ref 3.6–11.0)

## 2015-08-22 LAB — MRSA PCR SCREENING: MRSA BY PCR: NEGATIVE

## 2015-08-22 LAB — LIPASE, BLOOD: Lipase: 53 U/L — ABNORMAL HIGH (ref 11–51)

## 2015-08-22 MED ORDER — ONDANSETRON HCL 4 MG/2ML IJ SOLN
4.0000 mg | Freq: Once | INTRAMUSCULAR | Status: AC
  Administered 2015-08-22: 4 mg via INTRAVENOUS
  Filled 2015-08-22: qty 2

## 2015-08-22 MED ORDER — LATANOPROST 0.005 % OP SOLN
1.0000 [drp] | Freq: Every day | OPHTHALMIC | Status: DC
  Administered 2015-08-22 – 2015-08-24 (×3): 1 [drp] via OPHTHALMIC
  Filled 2015-08-22: qty 2.5

## 2015-08-22 MED ORDER — ENOXAPARIN SODIUM 40 MG/0.4ML ~~LOC~~ SOLN
40.0000 mg | SUBCUTANEOUS | Status: DC
  Administered 2015-08-22 – 2015-08-24 (×3): 40 mg via SUBCUTANEOUS
  Filled 2015-08-22 (×3): qty 0.4

## 2015-08-22 MED ORDER — DILTIAZEM HCL ER COATED BEADS 300 MG PO CP24
300.0000 mg | ORAL_CAPSULE | Freq: Every day | ORAL | Status: DC
  Administered 2015-08-23 – 2015-08-25 (×3): 300 mg via ORAL
  Filled 2015-08-22 (×3): qty 1

## 2015-08-22 MED ORDER — ONDANSETRON HCL 4 MG/2ML IJ SOLN
4.0000 mg | Freq: Four times a day (QID) | INTRAMUSCULAR | Status: DC | PRN
  Administered 2015-08-22: 4 mg via INTRAVENOUS
  Filled 2015-08-22: qty 2

## 2015-08-22 MED ORDER — ACETAMINOPHEN 325 MG PO TABS
650.0000 mg | ORAL_TABLET | Freq: Four times a day (QID) | ORAL | Status: DC | PRN
  Administered 2015-08-22 – 2015-08-23 (×2): 650 mg via ORAL
  Filled 2015-08-22 (×3): qty 2

## 2015-08-22 MED ORDER — SODIUM CHLORIDE 0.9 % IV SOLN
INTRAVENOUS | Status: DC
  Administered 2015-08-22 – 2015-08-23 (×2): via INTRAVENOUS

## 2015-08-22 MED ORDER — SODIUM CHLORIDE 0.9 % IV BOLUS (SEPSIS)
500.0000 mL | Freq: Once | INTRAVENOUS | Status: AC
  Administered 2015-08-22: 500 mL via INTRAVENOUS

## 2015-08-22 MED ORDER — ONDANSETRON HCL 4 MG PO TABS
4.0000 mg | ORAL_TABLET | Freq: Four times a day (QID) | ORAL | Status: DC | PRN
  Administered 2015-08-24: 4 mg via ORAL
  Filled 2015-08-22: qty 1

## 2015-08-22 MED ORDER — LEVOTHYROXINE SODIUM 50 MCG PO TABS
75.0000 ug | ORAL_TABLET | Freq: Every day | ORAL | Status: DC
  Administered 2015-08-24: 75 ug via ORAL
  Filled 2015-08-22 (×2): qty 1

## 2015-08-22 MED ORDER — LEVOTHYROXINE SODIUM 50 MCG PO TABS: 75.0000 ug | ORAL_TABLET | Freq: Every day | ORAL | Status: DC

## 2015-08-22 MED ORDER — ACETAMINOPHEN 650 MG RE SUPP: 650.0000 mg | Freq: Four times a day (QID) | RECTAL | Status: DC | PRN

## 2015-08-22 NOTE — H&P (Signed)
[facility] - [place] [facility]   PATIENT NAME: Zoe Hughes    MR#:  [id number]  DATE OF BIRTH:  28-Apr-1919  DATE OF ADMISSION:  08/22/2015  PRIMARY CARE PHYSICIAN: Clydie Braun, MD   REQUESTING/REFERRING PHYSICIAN: Dr. Huel Cote   nausea vomiting diarrhea for last 3 days. g generalized weakness eneralized weakness HISTORY OF PRESENT ILLNESS:  Zoe Hughes  is a 82 y.o. female with a known history of chronic A. fib, hypothyroidism comes to the emergency room from home place with nausea vomiting and diarrhea for the last 3 days. Patient reports other sick contacts at home place. She denies any nausea vomiting and diarrhea today however feels very weak and came to the emergency room was found to have sodium of 120. Patient's sodium normally stays 137-140. She is being admitted for acute hyponatremia suspected due to dehydration from GI losses.   PAST MEDICAL HISTORY:   Past Medical History  Diagnosis Date  . A-fib (HCC)   . Hypothyroid     PAST SURGICAL HISTOIRY:   Past Surgical History  Procedure Laterality Date  . Cholecystectomy    . Abdominal hysterectomy      SOCIAL HISTORY:   Social History  Substance Use Topics  . Smoking status: Never Smoker   . Smokeless tobacco: Never Used  . Alcohol Use: No    FAMILY HISTORY:  History reviewed. No pertinent family history.  DRUG ALLERGIES:   Allergies  Allergen Reactions  . Clarithromycin Diarrhea  . Aspirin Other (See Comments)    Other reaction(s): Blood Disorder-thin blood too much when patient takes too much or high doses of aspirin.  . Tetanus Toxoid Rash    REVIEW OF SYSTEMS:  Review of Systems  Constitutional: Negative for fever, chills and weight loss.  HENT: Negative for ear discharge, ear pain and nosebleeds.   Eyes: Negative for blurred vision, pain and discharge.  Respiratory: Negative for sputum production, shortness of breath, wheezing and stridor.   Cardiovascular:  Negative for chest pain, palpitations, orthopnea and PND.  Gastrointestinal: Negative for nausea, vomiting, abdominal pain and diarrhea.  Genitourinary: Negative for urgency and frequency.  Musculoskeletal: Negative for back pain and joint pain.  Neurological: Positive for weakness. Negative for sensory change, speech change and focal weakness.  Psychiatric/Behavioral: Negative for depression and hallucinations. The patient is not nervous/anxious.   All other systems reviewed and are negative.    MEDICATIONS AT HOME:   Prior to Admission medications   Medication Sig Start Date End Date Taking? Authorizing Provider  aspirin EC 81 MG tablet Take 81 mg by mouth every other day. 11/02/14 11/02/15 Yes Historical Provider, MD  diltiazem (CARDIZEM CD) 300 MG 24 hr capsule Take 300 mg by mouth daily. 08/06/15  Yes Historical Provider, MD  latanoprost (XALATAN) 0.005 % ophthalmic solution Place 1 drop into the right eye at bedtime.  08/13/15  Yes Historical Provider, MD  levothyroxine (SYNTHROID, LEVOTHROID) 75 MCG tablet Take 75 mcg by mouth daily. 07/28/15  Yes Historical Provider, MD  loratadine (CLARITIN) 10 MG tablet Take 10 mg by mouth daily as needed. For allergies. 09/04/14 09/04/15 Yes Historical Provider, MD      VITAL SIGNS:  Blood pressure 157/44, pulse 80, temperature 98.2 F (36.8 C), temperature source Oral, resp. rate 17, height  (1.549 m), weight 51.71 kg (114 lb), SpO2 96 %.  PHYSICAL EXAMINATION:  GENERAL:  82 y.o.-year-old patient lying in the bed with no acute distress.  EYES: Pupils equal, round, reactive to  light and accommodation. No scleral icterus. Extraocular muscles intact.  HEENT: Head atraumatic, normocephalic. Oropharynx and nasopharynx clear.  NECK:  Supple, no jugular venous distention. No thyroid enlargement, no tenderness.  LUNGS: Normal breath sounds bilaterally, no wheezing, rales,rhonchi or crepitation. No use of accessory muscles of respiration.  CARDIOVASCULAR:  S1, S2 normal. No murmurs, rubs, or gallops.  ABDOMEN: Soft, nontender, nondistended. Bowel sounds present. No organomegaly or mass.  EXTREMITIES: No pedal edema, cyanosis, or clubbing.  NEUROLOGIC: Cranial nerves II through XII are intact. Muscle strength 5/5 in all extremities. Sensation intact. Gait not checked.  PSYCHIATRIC: The patient is alert and oriented x 3.  SKIN: No obvious rash, lesion, or ulcer.   LABORATORY PANEL:   CBC  Recent Labs Lab 08/22/15 1034  WBC 9.1  HGB 14.2  HCT 41.3  PLT 183   ------------------------------------------------------------------------------------------------------------------  Chemistries   Recent Labs Lab 08/22/15 1034  NA 120*  K 3.9  CL 86*  CO2 22  GLUCOSE 141*  BUN 19  CREATININE 0.61  CALCIUM 8.5*  AST 110*  ALT 162*  ALKPHOS 131*  BILITOT 1.2   ------------------------------------------------------------------------------------------------------------------  Cardiac Enzymes No results for input(s): TROPONINI in the last 168 hours. ------------------------------------------------------------------------------------------------------------------  RADIOLOGY:  No results found.  EKG:  Chronic A. fib  IMPRESSION AND PLAN:  Zoe Hughes  is a 82 y.o. female with a known history of chronic A. fib, hypothyroidism comes to the emergency room from home place with nausea vomiting and diarrhea for the last 3 days. Patient reports other sick contacts at home place.    1. ACUTE HYPONATREMIA -Etiology appears GI losses/dehydration -Admit to medical floor -IV fluids, monitor I's and O's, monitor sodium -Regular diet   2. History of chronic A. fib continue diltiazem  3. Hypothyroidism continue Synthroid  4. DVT prophylaxis subcutaneous Lovenox  I will was discussed with patient and patient's daughter. Patient shows improvement in her sodium will discharge her to home place tomorrow. She is in agreement with the  plan. All the records are reviewed and case discussed with ED provider. Management plans discussed with the patient, family and they are in agreement.  CODE STATUS: full  TOTAL TIME TAKING CARE OF THIS PATIENT: 45  minutes.    Rickayla Wieland M.D on 08/22/2015 at 4:12 PM  Between 7am to 6pm - Pager - [phone]  After 6pm go to [email or web address] - password [email or web address] [facility]  [facility]  Office  [phone]  CC: Primary care physician; Clydie Braun, MD

## 2015-08-22 NOTE — Plan of Care (Signed)
Problem: Education: Goal: Knowledge of [place] [facility] Education information/materials will improve Outcome: Progressing Pt forgetful

## 2015-08-22 NOTE — ED Notes (Signed)
Pt c/o nausea, dizziness, and weakness since Thursday. Pt also c/o multiple falls in the last couple of days. Pt c/o multiple episodes of emesis and diarrhea over the last few days. Pt denies pain from the falls, denies LOC.

## 2015-08-22 NOTE — ED Notes (Signed)
Lab results reviewed. Awaiting urine sample. Patient awaiting room placement for MD eval.

## 2015-08-22 NOTE — ED Provider Notes (Signed)
Time Seen: Approximately 1330 I have reviewed the triage notes  Chief Complaint: Emesis; Diarrhea; Nausea; Dizziness; and Fall   History of Present Illness: Zoe Hughes is a 82 y.o. female who is had a recent 3-4 day history of nausea, vomiting and loose stool. She states she's had inability to maintain any consistent food or fluid intake over the last 24-48 hours. She is just now started to have a slow down in her bowel movements and states that she feels the diarrhea is improved and also the nausea however she has some generalized weakness. She states that she's had multiple falls and some lightheadedness over the last couple days without any acute injury or loss of consciousness. She denies any chest or abdominal pain. She states she has a history of hemorrhoids and is noticed some very small amount of bright red blood in her stool. She denies any rectal pain. She denies being on any significant blood thinners and has a history of atrial fibrillation   Past Medical History  Diagnosis Date  . A-fib (HCC)   . Hypothyroid     There are no active problems to display for this patient.   Past Surgical History  Procedure Laterality Date  . Cholecystectomy    . Abdominal hysterectomy      Past Surgical History  Procedure Laterality Date  . Cholecystectomy    . Abdominal hysterectomy      No current outpatient prescriptions on file.  Allergies:  Review of patient's allergies indicates no known allergies.  Family History: History reviewed. No pertinent family history.  Social History: Social History  Substance Use Topics  . Smoking status: Never Smoker   . Smokeless tobacco: Never Used  . Alcohol Use: No     Review of Systems:   10 point review of systems was performed and was otherwise negative:  Constitutional: No fever Eyes: No visual disturbances ENT: No sore throat, ear pain Cardiac: No chest pain Respiratory: No shortness of breath, wheezing, or  stridor Abdomen: She denies any focal abdominal pain currently but states over the last couple days she's had some cramping and points mainly to both upper abdominal regions. She still has some nausea but no persistent vomiting. She states loose stool without any frequent bowel movements at this time Endocrine: No weight loss, No night sweats Extremities: No peripheral edema, cyanosis Skin: No rashes, easy bruising Neurologic: No focal weakness, trouble with speech or swollowing Urologic: No dysuria, Hematuria, or urinary frequency   Physical Exam:  ED Triage Vitals  Enc Vitals Group     BP 08/22/15 1020 158/78 mmHg     Pulse Rate 08/22/15 1020 90     Resp 08/22/15 1020 18     Temp 08/22/15 1020 98.2 F (36.8 C)     Temp Source 08/22/15 1020 Oral     SpO2 08/22/15 1020 95 %     Weight 08/22/15 1020 114 lb (51.71 kg)     Height 08/22/15 1020  (1.549 m)     Head Cir --      Peak Flow --      Pain Score 08/22/15 1028 2     Pain Loc --      Pain Edu? --      Excl. in GC? --     General: Awake , Alert , and Oriented times 3; GCS 15 Head: Normal cephalic , atraumatic Eyes: Pupils equal , round, reactive to light Nose/Throat: No nasal drainage, patent upper airway without  erythema or exudate. Dry mucous membranes Neck: Supple, Full range of motion, No anterior adenopathy or palpable thyroid masses Lungs: Clear to ascultation without wheezes , rhonchi, or rales Heart: Regular rate, irregular rhythm without murmurs , gallops , or rubs Abdomen: Soft, non tender without rebound, guarding , or rigidity; bowel sounds positive and symmetric in all 4 quadrants. No organomegaly .   No focal tenderness over McBurney's point, negative Murphy's sign     Extremities: 2 plus symmetric pulses. No edema, clubbing or cyanosis Neurologic: normal ambulation, Motor symmetric without deficits, sensory intact Skin: warm, dry, no rashes   Labs:   All laboratory work was reviewed including any  pertinent negatives or positives listed below:  Labs Reviewed  LIPASE, BLOOD - Abnormal; Notable for the following:    Lipase 53 (*)    All other components within normal limits  COMPREHENSIVE METABOLIC PANEL - Abnormal; Notable for the following:    Sodium 120 (*)    Chloride 86 (*)    Glucose, Bld 141 (*)    Calcium 8.5 (*)    AST 110 (*)    ALT 162 (*)    Alkaline Phosphatase 131 (*)    All other components within normal limits  CBC  URINALYSIS COMPLETEWITH MICROSCOPIC ([facility] ONLY)   Review of laboratory work is significant for hyponatremia EKG: ED ECG REPORT I, Jennye Moccasin, the attending physician, personally viewed and interpreted this ECG.  Date: 08/22/2015 EKG Time: 1033 Rate: 89 Rhythm: Atrial fibrillation with left axis deviation QRS Axis: normal Intervals: normal ST/T Wave abnormalities: normal Conduction Disturbances: none Narrative Interpretation: unremarkable No acute ischemic changes are noted   ED Course: * Patient was started on IV normal saline fluid resuscitation for her hyponatremia. The patient's mental status seems to be at baseline at this time. She has had some episodes of what sounds like near syncope and is most likely dehydrated. Fortunately the patient's renal function appears to be within normal limits.    Assessment:  Dehydration Hyponatremia Viral gastroenteritis   Final Clinical Impression:   Final diagnoses:  Hyponatremia     Plan:  Inpatient management            Jennye Moccasin, MD 08/22/15 1517

## 2015-08-23 ENCOUNTER — Inpatient Hospital Stay: Payer: Medicare Other

## 2015-08-23 DIAGNOSIS — R079 Chest pain, unspecified: Secondary | ICD-10-CM

## 2015-08-23 LAB — TROPONIN I
TROPONIN I: 0.06 ng/mL — AB (ref <0.031)
Troponin I: 0.06 ng/mL — ABNORMAL HIGH

## 2015-08-23 LAB — BASIC METABOLIC PANEL
Anion gap: 8 (ref 5–15)
BUN: 18 mg/dL (ref 6–20)
CALCIUM: 8.4 mg/dL — AB (ref 8.9–10.3)
CHLORIDE: 92 mmol/L — AB (ref 101–111)
CO2: 24 mmol/L (ref 22–32)
CREATININE: 0.65 mg/dL (ref 0.44–1.00)
GFR calc non Af Amer: >60 mL/min (ref >60)
Glucose, Bld: 130 mg/dL — ABNORMAL HIGH (ref 65–99)
Potassium: 4.2 mmol/L (ref 3.5–5.1)
Sodium: 124 mmol/L — ABNORMAL LOW (ref 135–145)

## 2015-08-23 LAB — CK: Total CK: 347 U/L — ABNORMAL HIGH (ref 38–234)

## 2015-08-23 MED ORDER — ASPIRIN EC 81 MG PO TBEC
81.0000 mg | DELAYED_RELEASE_TABLET | ORAL | Status: DC
  Administered 2015-08-23 – 2015-08-25 (×2): 81 mg via ORAL
  Filled 2015-08-23: qty 1

## 2015-08-23 MED ORDER — ASPIRIN 81 MG PO CHEW
CHEWABLE_TABLET | ORAL | Status: AC
  Administered 2015-08-23: 81 mg
  Filled 2015-08-23: qty 1

## 2015-08-23 MED ORDER — METOPROLOL TARTRATE 25 MG PO TABS
25.0000 mg | ORAL_TABLET | Freq: Two times a day (BID) | ORAL | Status: DC
  Administered 2015-08-23 – 2015-08-25 (×5): 25 mg via ORAL
  Filled 2015-08-23 (×5): qty 1

## 2015-08-23 MED ORDER — ALBUTEROL SULFATE (2.5 MG/3ML) 0.083% IN NEBU
2.5000 mg | INHALATION_SOLUTION | RESPIRATORY_TRACT | Status: DC | PRN
  Administered 2015-08-23 (×3): 2.5 mg via RESPIRATORY_TRACT
  Filled 2015-08-23 (×3): qty 3

## 2015-08-23 MED ORDER — LEVOFLOXACIN IN D5W 500 MG/100ML IV SOLN
500.0000 mg | Freq: Once | INTRAVENOUS | Status: AC
  Administered 2015-08-23: 500 mg via INTRAVENOUS
  Filled 2015-08-23: qty 100

## 2015-08-23 MED ORDER — LORATADINE 10 MG PO TABS: 10.0000 mg | ORAL_TABLET | Freq: Every day | ORAL | Status: DC | PRN

## 2015-08-23 MED ORDER — MORPHINE SULFATE (PF) 2 MG/ML IV SOLN
1.0000 mg | Freq: Once | INTRAVENOUS | Status: AC
  Administered 2015-08-23: 1 mg via INTRAVENOUS
  Filled 2015-08-23: qty 1

## 2015-08-23 MED ORDER — LEVOFLOXACIN IN D5W 250 MG/50ML IV SOLN
250.0000 mg | INTRAVENOUS | Status: DC
  Administered 2015-08-24: 250 mg via INTRAVENOUS
  Filled 2015-08-23 (×2): qty 50

## 2015-08-23 MED ORDER — FUROSEMIDE 10 MG/ML IJ SOLN
40.0000 mg | Freq: Once | INTRAMUSCULAR | Status: AC
  Administered 2015-08-23: 40 mg via INTRAVENOUS
  Filled 2015-08-23: qty 4

## 2015-08-23 NOTE — Progress Notes (Signed)
Paged MD. EKG results abnormal. Dr. Allena Katz aware, new orders for morphine and aspirin. Chest xray pending. Will continue to monitor.

## 2015-08-23 NOTE — Progress Notes (Signed)
MD notified of pt c/o SOB audible expiratory wheezing heard when BS auscultated. Pt placed on O2 earlier d/t sats 88 on RA. New orders received per Pyreddy

## 2015-08-23 NOTE — Progress Notes (Addendum)
Pt reports improved chest pain. SOB relieved. Continued with O2 @ 2L, attempted to wean, pt desat to 88%. Resting comfortably after last dose of metoprolol.

## 2015-08-23 NOTE — Care Management Note (Addendum)
Case Management Note  Patient Details  Name: Zoe Hughes MRN: [id number] Date of Birth: [birth year]  Subjective/Objective:   82yo Mrs [name] was admitted 08/22/15 per N/V/D, dizziness, fall. She resides at [facility].Marland Kitchen Hx: A-fib on ASA therapy. Daughter Ferd Glassing [phone] provides transportation. Home equipment includes a toilet seat riser and a rolling walker. No home oxygen and no home health services. PCP=Dr Clydie Braun. Pharmacy=[facility]. [name] anticipates returning to [facility] after this hospital discharge. [name] has a list of home health providers to discuss with her daughter. Case management will follow for discharge planning.                  Action/Plan:   Expected Discharge Date:                  Expected Discharge Plan:     In-House Referral:     Discharge planning Services     Post Acute Care Choice:    Choice offered to:     DME Arranged:    DME Agency:     HH Arranged:    HH Agency:     Status of Service:     Medicare Important Message Given:  Yes Date Medicare IM Given:    Medicare IM give by:    Date Additional Medicare IM Given:    Additional Medicare Important Message give by:     If discussed at Long Length of Stay Meetings, dates discussed:    Additional Comments:  Nazair Fortenberry A, RN 08/23/2015, 9:36 AM

## 2015-08-23 NOTE — Clinical Documentation Improvement (Signed)
Hospitalist  Please update your documentation within the medical record to reflect your response to this query. Thank you  Can the diagnosis of "right LL infiltrate" be further specified?   Type of Pneumonia - CAP, HCAP, Bacterial Pneumonia, Aspiration Pneumonia, Gram Negative Pneumonia, Other Condition, Unable to Clinically Determine  Document causative organism if known  Document mechanism - Aspiration, Ventilator - Associated, Radiation Induced, Other (Specify)  Document any associated diagnoses/conditions such as Respiratory Failure, Sepsis, Underlying Lung Disease (Specify), Other (Specify)  Other condition  Clinically Undetermined  Supporting Information: :  08/23/15 progr note..."cxr seen by me. Pt is developing right LL infiltrate. Pt has been c/o chest pain with breathing and drop in her sats explains it. Will start IV levaquin.".Marland KitchenMarland Kitchen  Please exercise your independent, professional judgment when responding. A specific answer is not anticipated or expected.  Thank You, Toribio Harbour, RN, BSN, CCDS Certified Clinical Documentation Specialist [place]: Health Information Management [phone]

## 2015-08-23 NOTE — Clinical Social Work Note (Signed)
Clinical Social Work Assessment  Patient Details  Name: Zoe Hughes MRN: [id number] Date of Birth: [birth year]  Date of referral:  08/23/15               Reason for consult:  Facility Placement, Other (Comment Required) (From [place] )                Permission sought to share information with:  Chartered certified accountant granted to share information::  Yes, Verbal Permission Granted  Name[facility]  Agency::   [facility]   Relationship::     Contact Information:     Housing/Transportation Living arrangements for the past 2 months:  [place] of Information:  Patient, Facility Patient Interpreter Needed:  None Criminal Activity/Legal Involvement Pertinent to Current Situation/Hospitalization:  No - Comment as needed Significant Relationships:  Adult Children Lives with:  Self Do you feel safe going back to the place where you live?  Yes Need for family participation in patient care:  Yes (Comment)  Care giving concerns:  Patient lives in the [place] at [facility].    Social Worker assessment / plan:  Holiday representative (CSW) received consult that patient is from [facility]. CSW contacted [facility]. Per Horris Latino patient is from the independent living section at [facility]. Per Horris Latino patient does not require an FL2 to return and is independent with all ADL's. Per Horris Latino patient has lived in the independent living section for several years. CSW met with patient to discuss D/C plan. Patient was alert and oriented and sitting up in the bed. CSW introduced self and explained role of CSW department. Patient reported that she lives in an apartment at [facility]. Per patient her daughter Zoe Hughes is her primary support. Patient reported that she wants to return to her apartment with home health and requested a PT named Tommy. Per Horris Latino patient can be set up with  [facility]. RN Case Manager is aware of above. PT is pending. CSW will continue to follow and assist as needed.   Employment status:  Retired Nurse, adult PT Recommendations:  Not assessed at this time [place] / Referral to community resources:  [place] ([place]. SNF )  Patient/Family's Response to care: Patient is agreeable to return to her apartment at [facility].   Patient/Family's Understanding of and Emotional Response to Diagnosis, Current Treatment, and Prognosis:  Patient was pleasant throughout assessment.   Emotional Assessment Appearance:  Appears younger than stated age Attitude/Demeanor/Rapport:    Affect (typically observed):  Accepting, Adaptable, Pleasant Orientation:  Oriented to Self, Oriented to Place, Oriented to  Time, Oriented to Situation Alcohol / Substance use:  Not Applicable Psych involvement (Current and /or in the community):  No (Comment)  Discharge Needs  Concerns to be addressed:  Discharge Planning Concerns Readmission within the last 30 days:  No Current discharge risk:  Other (PT is pending ) Barriers to Discharge:  Continued Medical Work up   Loralyn Freshwater, LCSW 08/23/2015, 11:08 AM

## 2015-08-23 NOTE — Progress Notes (Signed)
PT Cancellation Note  Patient Details Name: Zoe Hughes MRN: [id number] DOB: 02-21-1919   Cancelled Treatment:    Reason Eval/Treat Not Completed: Medical issues which prohibited therapy. Chart reviewed and RN consulted. Pt with new onset chest pain and low SaO2. CXR revealed PNA. Troponins being cycled and first readings is only minimally elevated at 0.06 at 10:23. Went to see patient and she is still complaining of central to L sided chest pressure 6/10 intensity. Rechecked BP and it remains elevated at 176/95. HR fluctuating between 105 to 120 bpm at rest. Pt does not feel well enough to participate with PT currently and she looks unwell. RN notified of elevated BP, HR, and chest pain. Will attempt PT evaluation as soon as pt is appropriate for physical therapy evaluation.  Sharalyn Ink Huprich PT, DPT   Huprich,Jason 08/23/2015, 2:25 PM

## 2015-08-23 NOTE — [place] FL2 (Signed)
[place] MEDICAID FL2 LEVEL OF CARE SCREENING TOOL     IDENTIFICATION  Patient Name: Zoe Hughes Birthdate: 24-May-1919 Sex: female Admission Date (Current Location): 08/22/2015  [place] and [place] Number:  Chiropodist and Address:  [facility], [address], [place], [place] [address]      Provider Number: [id number]  Attending Physician Name and Address:  Enedina Finner, MD  Relative Name and Phone Number:       Current Level of Care: Hospital Recommended Level of Care: Skilled Nursing Facility Prior Approval Number:    Date Approved/Denied:   PASRR Number:  ( [id number] )  Discharge Plan: SNF    Current Diagnoses: Patient Active Problem List   Diagnosis Date Noted  . Hyponatremia 08/22/2015   . A-fib (HCC)   . Hypothyroid          Orientation RESPIRATION BLADDER Height & Weight     Self, Time, Situation, Place  O2 (2 Liters Oxygen ) Incontinent Weight: 114 lb (51.71 kg) Height:   (154.9 cm)  BEHAVIORAL SYMPTOMS/MOOD NEUROLOGICAL BOWEL NUTRITION STATUS   (none )  (none) Incontinent Diet (Diet: Soft )  AMBULATORY STATUS COMMUNICATION OF NEEDS Skin   Extensive Assist Verbally Normal                       Personal Care Assistance Level of Assistance  Bathing, Feeding, Dressing Bathing Assistance: Limited assistance Feeding assistance: Independent Dressing Assistance: Limited assistance     Functional Limitations Info  Sight, Hearing, Speech Sight Info: Adequate Hearing Info: Impaired Speech Info: Adequate    SPECIAL CARE FACTORS FREQUENCY  PT (By licensed PT), OT (By licensed OT)     PT Frequency:  (5) OT Frequency:  (5)            Contractures      Additional Factors Info  Code Status, Allergies Code Status Info:  (Full Code. ) Allergies Info:  (Clarithromycin, Aspirin, Tetanus Toxoid)           Current Medications (08/23/2015):  This is the current hospital active medication  list Current Facility-Administered Medications  Medication Dose Route Frequency Provider Last Rate Last Dose  . 0.9 %  sodium chloride infusion   Intravenous Continuous Enedina Finner, MD 100 mL/hr at 08/22/15 1702    . acetaminophen (TYLENOL) tablet 650 mg  650 mg Oral Q6H PRN Enedina Finner, MD   650 mg at 08/22/15 1833   Or  . acetaminophen (TYLENOL) suppository 650 mg  650 mg Rectal Q6H PRN Enedina Finner, MD      . albuterol (PROVENTIL) (2.5 MG/3ML) 0.083% nebulizer solution 2.5 mg  2.5 mg Nebulization Q4H PRN Ihor [place], MD   2.5 mg at 08/23/15 0612  . diltiazem (CARDIZEM CD) 24 hr capsule 300 mg  300 mg Oral Daily Enedina Finner, MD   300 mg at 08/22/15 1659  . enoxaparin (LOVENOX) injection 40 mg  40 mg Subcutaneous Q24H Enedina Finner, MD   40 mg at 08/22/15 1831  . latanoprost (XALATAN) 0.005 % ophthalmic solution 1 drop  1 drop Right Eye QHS Enedina Finner, MD   1 drop at 08/22/15 2149  . levothyroxine (SYNTHROID, LEVOTHROID) tablet 75 mcg  75 mcg Oral QAC breakfast Enedina Finner, MD   75 mcg at 08/23/15 0800  . ondansetron (ZOFRAN) tablet 4 mg  4 mg Oral Q6H PRN Enedina Finner, MD       Or  . ondansetron [facility]) injection 4  mg  4 mg Intravenous Q6H PRN Enedina Finner, MD   4 mg at 08/22/15 2337     Discharge Medications: Please see discharge summary for a list of discharge medications.  Relevant Imaging Results:  Relevant Lab Results:   Additional Information  (SSN: [id number])  Haig Prophet, LCSW

## 2015-08-23 NOTE — Care Management Important Message (Signed)
Important Message  Patient Details  Name: Zoe Hughes MRN: [id number] Date of Birth: [birth year]   Medicare Important Message Given:  Yes    Deadrick Stidd A, RN 08/23/2015, 8:13 AM

## 2015-08-23 NOTE — Progress Notes (Signed)
cxr seen by me. Pt is developing right LL infiltrate. Pt has been c/o chest pain with breathing and drop in her sats explains it Will start IV levaquin

## 2015-08-23 NOTE — Progress Notes (Signed)
[facility] - [place] at [facility]   PATIENT NAME: Zoe Hughes    MR#:  [id number]  DATE OF BIRTH:  05-20-1919  SUBJECTIVE:  C/o chest discomfort on swallowing. Did eat breakfast. No sob Placed on 1.5 liter [place] after sats dropped to 80's last nite sats 96% on oxygen  REVIEW OF SYSTEMS:   Review of Systems  Constitutional: Negative for fever, chills and weight loss.  HENT: Negative for ear discharge, ear pain and nosebleeds.   Eyes: Negative for blurred vision, pain and discharge.  Respiratory: Positive for shortness of breath. Negative for sputum production, wheezing and stridor.   Cardiovascular: Positive for chest pain. Negative for palpitations, orthopnea and PND.  Gastrointestinal: Negative for nausea, vomiting, abdominal pain and diarrhea.  Genitourinary: Negative for urgency and frequency.  Musculoskeletal: Negative for back pain and joint pain.  Neurological: Positive for weakness. Negative for sensory change, speech change and focal weakness.  Psychiatric/Behavioral: Negative for depression and hallucinations. The patient is not nervous/anxious.   All other systems reviewed and are negative.  Tolerating Diet:yes Tolerating PT: pending  DRUG ALLERGIES:   Allergies  Allergen Reactions  . Clarithromycin Diarrhea  . Aspirin Other (See Comments)    Other reaction(s): Blood Disorder-thin blood too much when patient takes too much or high doses of aspirin.  . Tetanus Toxoid Rash    VITALS:  Blood pressure 153/75, pulse 89, temperature 98 F (36.7 C), temperature source Oral, resp. rate 18, height  (1.549 m), weight 51.71 kg (114 lb), SpO2 97 %.  PHYSICAL EXAMINATION:   Physical Exam  GENERAL:  82 y.o.-year-old patient lying in the bed with no acute distress.  EYES: Pupils equal, round, reactive to light and accommodation. No scleral icterus. Extraocular muscles intact.  HEENT: Head atraumatic, normocephalic. Oropharynx and nasopharynx  clear.  NECK:  Supple, no jugular venous distention. No thyroid enlargement, no tenderness.  LUNGS: Normal breath sounds bilaterally, no wheezing, rales, rhonchi. No use of accessory muscles of respiration.  CARDIOVASCULAR: S1, S2 normal. No murmurs, rubs, or gallops.  ABDOMEN: Soft, nontender, nondistended. Bowel sounds present. No organomegaly or mass.  EXTREMITIES: No cyanosis, clubbing or edema b/l.    NEUROLOGIC: Cranial nerves II through XII are intact. No focal Motor or sensory deficits b/l.   PSYCHIATRIC:  patient is alert and oriented x 3.  SKIN: No obvious rash, lesion, or ulcer.   LABORATORY PANEL:  CBC  Recent Labs Lab 08/22/15 1034  WBC 9.1  HGB 14.2  HCT 41.3  PLT 183    Chemistries   Recent Labs Lab 08/22/15 1034 08/23/15 0418  NA 120* 124*  K 3.9 4.2  CL 86* 92*  CO2 22 24  GLUCOSE 141* 130*  BUN 19 18  CREATININE 0.61 0.65  CALCIUM 8.5* 8.4*  AST 110*  --   ALT 162*  --   ALKPHOS 131*  --   BILITOT 1.2  --     ASSESSMENT AND PLAN:  Zoe Hughes is a 82 y.o. female with a known history of chronic A. fib, hypothyroidism comes to the emergency room from home place with nausea vomiting and diarrhea for the last 3 days. Patient reports other sick contacts at home place.   1. ACUTE HYPONATREMIA -Etiology appears GI losses/dehydration -IV fluids, monitor I's and O's, monitor sodium -Regular diet -sodium 120--124  2. History of chronic A. fib continue diltiazem  3. Hypothyroidism continue Synthroid  4. DVT prophylaxis subcutaneous Lovenox  5. SOB, chest pain etio? Get  CE x1 and cxr -pt has h/o asthma -prn nebs  6.right toe bruise after fall -xray right foot  PT consulted   Case discussed with Care Management/Social Worker. Management plans discussed with the patient, family and they are in agreement.  CODE STATUS: full  DVT Prophylaxis: lovneox  TOTAL TIME TAKING CARE OF THIS PATIENT: 35 minutes.  >50% time spent on counselling  and coordination of care  POSSIBLE D/C IN 1-2 DAYS, DEPENDING ON CLINICAL CONDITION.  Note: This dictation was prepared with Dragon dictation along with smaller phrase technology. Any transcriptional errors that result from this process are unintentional.  Zoe Hughes M.D on 08/23/2015 at 9:57 AM  Between 7am to 6pm - Pager - [phone]  After 6pm go to [email or web address] - password [email or web address] [facility]  [place] [facility]  Office  [phone]  CC: Primary care physician; Clydie Braun, MD

## 2015-08-24 LAB — SODIUM: Sodium: 126 mmol/L — ABNORMAL LOW (ref 135–145)

## 2015-08-24 LAB — TROPONIN I: Troponin I: 0.1 ng/mL — ABNORMAL HIGH (ref <0.031)

## 2015-08-24 MED ORDER — BISACODYL 10 MG RE SUPP
10.0000 mg | Freq: Once | RECTAL | Status: AC
  Administered 2015-08-24: 10 mg via RECTAL
  Filled 2015-08-24: qty 1

## 2015-08-24 MED ORDER — ALUM & MAG HYDROXIDE-SIMETH 200-200-20 MG/5ML PO SUSP
30.0000 mL | Freq: Four times a day (QID) | ORAL | Status: DC | PRN
  Administered 2015-08-24: 30 mL via ORAL
  Filled 2015-08-24: qty 30

## 2015-08-24 MED ORDER — PANTOPRAZOLE SODIUM 40 MG PO TBEC
40.0000 mg | DELAYED_RELEASE_TABLET | Freq: Every day | ORAL | Status: DC
  Administered 2015-08-24 – 2015-08-25 (×2): 40 mg via ORAL
  Filled 2015-08-24 (×2): qty 1

## 2015-08-24 MED ORDER — FUROSEMIDE 10 MG/ML IJ SOLN
20.0000 mg | Freq: Once | INTRAMUSCULAR | Status: AC
  Administered 2015-08-24: 20 mg via INTRAVENOUS
  Filled 2015-08-24: qty 2

## 2015-08-24 MED ORDER — SENNOSIDES-DOCUSATE SODIUM 8.6-50 MG PO TABS
1.0000 | ORAL_TABLET | Freq: Two times a day (BID) | ORAL | Status: DC
  Administered 2015-08-24 – 2015-08-25 (×3): 1 via ORAL
  Filled 2015-08-24 (×3): qty 1

## 2015-08-24 NOTE — Evaluation (Signed)
Physical Therapy Evaluation Patient Details Name: Zoe Hughes MRN: [id number] DOB: 02/18/1919 Today's Date: 08/24/2015   History of Present Illness  Pt admitted for hyponatremia. Pt with history of Afib and hypothyroidism. Pt complains of nausea/vomiting x 3 days. Pt now with pneumonia.   Clinical Impression  Pt is a pleasant 82 year old female who was admitted for hyponatremia. Pt performs bed mobility, transfers, and ambulation with min assist and rw. Pt demonstrates deficits with strength/balance/endurance/mobility. Pt currently on 3L of O2 and desats quickly when on room air. Unable to wean at this time. Would benefit from skilled PT to address above deficits and promote optimal return to PLOF.      Follow Up Recommendations SNF    Equipment Recommendations       Recommendations for Other Services       Precautions / Restrictions Precautions Precautions: Fall Restrictions Weight Bearing Restrictions: No      Mobility  Bed Mobility Overal bed mobility: Needs Assistance Bed Mobility: Supine to Sit     Supine to sit: Min assist     General bed mobility comments: assist for bed mobility and cues for sequencing. Once seated at EOB, pt able to sit with supervision  Transfers Overall transfer level: Needs assistance Equipment used: Rolling walker (2 wheeled) Transfers: Sit to/from Stand Sit to Stand: Min assist         General transfer comment: assist for balance once standing with unsteadiness noted.  Ambulation/Gait Ambulation/Gait assistance: Min assist;+2 safety/equipment Ambulation Distance (Feet): 25 Feet Assistive device: Rolling walker (2 wheeled) Gait Pattern/deviations: Step-to pattern     General Gait Details: ambulated in room with unsteadiness noted. Assist required for obstacle avoidance and safe technique. 3L of O2 donned for ambulation with sats decreasing to 88%. RN notified  Stairs            Wheelchair Mobility    Modified  Rankin (Stroke Patients Only)       Balance Overall balance assessment: Needs assistance;History of Falls Sitting-balance support: Feet supported Sitting balance-Leahy Scale: Good     Standing balance support: Bilateral upper extremity supported Standing balance-Leahy Scale: Fair                               Pertinent Vitals/Pain Pain Assessment: No/denies pain    Home Living Family/patient expects to be discharged to:: Assisted living               Home Equipment: Walker - 2 wheels;Toilet riser      Prior Function Level of Independence: Independent with assistive device(s)         Comments: uses rollater (without seat)     Hand Dominance        Extremity/Trunk Assessment   Upper Extremity Assessment: Generalized weakness (grossly 3+/5)           Lower Extremity Assessment: Generalized weakness (grossly 3+/5)         Communication   Communication: No difficulties  Cognition Arousal/Alertness: Awake/alert Behavior During Therapy: WFL for tasks assessed/performed Overall Cognitive Status: Within Functional Limits for tasks assessed                      General Comments      Exercises Other Exercises Other Exercises: Pt ambulated to [facility] with min assist and needed min assist for safety/sequencing/hygiene needs. Pt then stood at sink to brush teeth with constant cga secondary to balance  impairments. Performed on RA with sats decreasing to 81%. Pt needed seated rest break with cues for pursed lip breathing. O2 donned and sats improved to 88%.      Assessment/Plan    PT Assessment Patient needs continued PT services  PT Diagnosis Difficulty walking;Generalized weakness   PT Problem List Decreased strength;Decreased activity tolerance;Decreased balance;Decreased mobility;Decreased knowledge of use of DME;Cardiopulmonary status limiting activity  PT Treatment Interventions DME instruction;Gait training;Therapeutic exercise    PT Goals (Current goals can be found in the Care Plan section) Acute Rehab PT Goals Patient Stated Goal: to get stronger PT Goal Formulation: With patient Time For Goal Achievement: 09/07/15 Potential to Achieve Goals: Good    Frequency Min 2X/week   Barriers to discharge        Co-evaluation               End of Session Equipment Utilized During Treatment: Gait belt;Oxygen Activity Tolerance: Patient limited by fatigue Patient left: in chair;with chair alarm set Nurse Communication: Mobility status         Time: 1610-9604 PT Time Calculation (min) (ACUTE ONLY): 22 min   Charges:   PT Evaluation $PT Eval Moderate Complexity: 1 Procedure PT Treatments $Therapeutic Activity: 8-22 mins   PT G Codes:        [place] Haub 09-04-2015, 2:26 PM Elizabeth Palau, PT, DPT [phone]

## 2015-08-24 NOTE — Progress Notes (Signed)
Pt experiencing indigestion when she swallows her food. MD Mody notifed. MD placing orders. Will continue to monitor.

## 2015-08-24 NOTE — Clinical Social Work Placement (Signed)
   CLINICAL SOCIAL WORK PLACEMENT  NOTE  Date:  08/24/2015  Patient Details  Name: Zoe Hughes MRN: [id number] Date of Birth: 1919-02-25  Clinical Social Work is seeking post-discharge placement for this patient at the Skilled  Nursing Facility level of care (*CSW will initial, date and re-position this form in  chart as items are completed):  Yes   Patient/family provided with [place] Clinical Social Work Department's list of facilities offering this level of care within the geographic area requested by the patient (or if unable, by the patient's family).  Yes   Patient/family informed of their freedom to choose among providers that offer the needed level of care, that participate in Medicare, Medicaid or managed care program needed by the patient, have an available bed and are willing to accept the patient.  Yes   Patient/family informed of [facility]'s ownership interest in [facility] and [facility], as well as of the fact that they are under no obligation to receive care at these facilities.  PASRR submitted to EDS on       PASRR number received on       Existing PASRR number confirmed on 08/24/15     FL2 transmitted to all facilities in geographic area requested by pt/family on 08/24/15     FL2 transmitted to all facilities within larger geographic area on       Patient informed that his/her managed care company has contracts with or will negotiate with certain facilities, including the following:        Yes   Patient/family informed of bed offers received.  Patient chooses bed at  [facility] )     Physician recommends and patient chooses bed at      Patient to be transferred to   on  .  Patient to be transferred to facility by       Patient family notified on   of transfer.  Name of family member notified:        PHYSICIAN       Additional Comment:    _______________________________________________ Haig Prophet, LCSW 08/24/2015,  10:36 AM

## 2015-08-24 NOTE — Progress Notes (Signed)
[facility] Phys[facility] - [place] at [facility]   PATIENT NAME: Zoe Hughes    MR#:  [id number]  DATE OF BIRTH:  03/06/1919  SUBJECTIVE:  No chest pain this morning. Breathing little better. Feels shaky. Slept better. REVIEW OF SYSTEMS:   Review of Systems  Constitutional: Negative for fever, chills and weight loss.  HENT: Negative for ear discharge, ear pain and nosebleeds.   Eyes: Negative for blurred vision, pain and discharge.  Respiratory: Positive for shortness of breath. Negative for sputum production, wheezing and stridor.   Cardiovascular: Positive for chest pain. Negative for palpitations, orthopnea and PND.  Gastrointestinal: Negative for nausea, vomiting, abdominal pain and diarrhea.  Genitourinary: Negative for urgency and frequency.  Musculoskeletal: Negative for back pain and joint pain.  Neurological: Positive for weakness. Negative for sensory change, speech change and focal weakness.  Psychiatric/Behavioral: Negative for depression and hallucinations. The patient is not nervous/anxious.   All other systems reviewed and are negative.  Tolerating Diet:yes Tolerating PT: Recommends rehabilitation  DRUG ALLERGIES:   Allergies  Allergen Reactions  . Clarithromycin Diarrhea  . Aspirin Other (See Comments)    Other reaction(s): Blood Disorder-thin blood too much when patient takes too much or high doses of aspirin.  . Tetanus Toxoid Rash    VITALS:  Blood pressure 139/55, pulse 70, temperature 97.5 F (36.4 C), temperature source Oral, resp. rate 18, height  (1.549 m), weight 51.71 kg (114 lb), SpO2 94 %.  PHYSICAL EXAMINATION:   Physical Exam  GENERAL:  82 y.o.-year-old patient lying in the bed with no acute distress.  EYES: Pupils equal, round, reactive to light and accommodation. No scleral icterus. Extraocular muscles intact.  HEENT: Head atraumatic, normocephalic. Oropharynx and nasopharynx clear.  NECK:  Supple, no jugular venous  distention. No thyroid enlargement, no tenderness.  LUNGS: Crackle sounds bilaterally in the bases, no wheezing, rales, rhonchi. No use of accessory muscles of respiration.  CARDIOVASCULAR: S1, S2 normal. No murmurs, rubs, or gallops.  ABDOMEN: Soft, nontender, nondistended. Bowel sounds present. No organomegaly or mass.  EXTREMITIES: No cyanosis, clubbing or edema b/l.    NEUROLOGIC: Cranial nerves II through XII are intact. No focal Motor or sensory deficits b/l.   PSYCHIATRIC:  patient is alert and oriented x 3.  SKIN: No obvious rash, lesion, or ulcer.   LABORATORY PANEL:  CBC  Recent Labs Lab 08/22/15 1034  WBC 9.1  HGB 14.2  HCT 41.3  PLT 183    Chemistries   Recent Labs Lab 08/22/15 1034 08/23/15 0418 08/24/15 0306  NA 120* 124* 126*  K 3.9 4.2  --   CL 86* 92*  --   CO2 22 24  --   GLUCOSE 141* 130*  --   BUN 19 18  --   CREATININE 0.61 0.65  --   CALCIUM 8.5* 8.4*  --   AST 110*  --   --   ALT 162*  --   --   ALKPHOS 131*  --   --   BILITOT 1.2  --   --     ASSESSMENT AND PLAN:  Zoe Hughes is a 82 y.o. female with a known history of chronic A. fib, hypothyroidism comes to the emergency room from home place with nausea vomiting and diarrhea for the last 3 days. Patient reports other sick contacts at home place.   1. ACUTE HYPONATREMIA -Etiology appears GI losses/dehydration -IV fluids, monitor I's and O's, monitor sodium -Regular diet -sodium 120--124--126 -Patient could be  developing some volume overload given her chest x-ray findings I will hold off on fluids.  2. History of chronic A. fib continue diltiazem  3. Hypothyroidism continue Synthroid  4. DVT prophylaxis subcutaneous Lovenox  5. Right lobar lobe infiltrate, community-acquired with possible mild pulmonary edema Now on IV Levaquin Blood cultures negative -pt has h/o asthma -prn nebs -Received Lasix 1 today -Type to wean oxygen off  6.right toe bruise after fall -xray right  foot no acute trauma-  PT consultedRecommends rehabilitation patient and family agreeable If continues to improve with DC to rehabilitation tomorrow  Case discussed with Care Management/Social Worker. Management plans discussed with the patient, family and they are in agreement.  CODE STATUS: full  DVT Prophylaxis: lovneox  TOTAL TIME TAKING CARE OF THIS PATIENT: 35 minutes.  >50% time spent on counselling and coordination of care  POSSIBLE D/C IN 1-2 DAYS, DEPENDING ON CLINICAL CONDITION.  Note: This dictation was prepared with Dragon dictation along with smaller phrase technology. Any transcriptional errors that result from this process are unintentional.  Latoya Diskin M.D on 08/24/2015 at 1:15 PM  Between 7am to 6pm - Pager - [phone]  After 6pm go to [email or web address] - password [email or web address] [facility]  [place] [facility]  Office  [phone]  CC: Primary care physician; Clydie Braun, MD

## 2015-08-24 NOTE — Progress Notes (Signed)
Spoke with JD, [employer] rep at [phone], to notify of non-emergent EMS transport.  Auth notification reference given as [id number].   Service date range good from 08/24/15 - 11/22/15.   Gap exception requested to determine if services can be considered at an in-network level.

## 2015-08-24 NOTE — Progress Notes (Signed)
Pts troponin 0.10, MD Patel notified. MD placing order for cardio consult. MD also notified that pt has not had a bowel movement since last Thursday, orders received for suppository. Will continue to monitor.

## 2015-08-24 NOTE — Progress Notes (Signed)
PT is recommending SNF. Clinical Social Worker (CSW) met with patient to discuss D/C plan. Patient is agreeable to SNF search in [facility] and reported that she has been to [facility] before.   CSW presented bed offers to patient. She chose [facility]. Kim admissions coordinator at [facility] is aware of above. CSW contacted patient's daughter Inez Catalina and made her aware of above. Per Inez Catalina she is in agreement with plan. CSW will continue to follow and assist as needed.   Blima Rich, LCSW [phone]

## 2015-08-25 ENCOUNTER — Encounter
Admission: RE | Admit: 2015-08-25 | Discharge: 2015-08-25 | Disposition: A | Discharge status: Home / Self Care | Payer: Medicare Other | Source: Ambulatory Visit | Attending: Internal Medicine | Admitting: Internal Medicine

## 2015-08-25 LAB — CBC
HCT: 35.6 % (ref 35.0–47.0)
Hemoglobin: 12.2 g/dL (ref 12.0–16.0)
MCH: 29.7 pg (ref 26.0–34.0)
MCHC: 34.4 g/dL (ref 32.0–36.0)
MCV: 86.3 fL (ref 80.0–100.0)
PLATELETS: 172 10*3/uL (ref 150–440)
RBC: 4.12 MIL/uL (ref 3.80–5.20)
RDW: 13.6 % (ref 11.5–14.5)
WBC: 9.3 10*3/uL (ref 3.6–11.0)

## 2015-08-25 LAB — CREATININE, SERUM
CREATININE: 0.57 mg/dL (ref 0.44–1.00)
GFR calc Af Amer: >60 mL/min (ref >60)

## 2015-08-25 LAB — SODIUM: SODIUM: 128 mmol/L — AB (ref 135–145)

## 2015-08-25 MED ORDER — LEVOFLOXACIN 500 MG PO TABS
250.0000 mg | ORAL_TABLET | Freq: Every day | ORAL | Status: DC
  Administered 2015-08-25: 250 mg via ORAL
  Filled 2015-08-25: qty 1

## 2015-08-25 MED ORDER — HYDROCORTISONE 2.5 % RE CREA: TOPICAL_CREAM | Freq: Three times a day (TID) | RECTAL | Status: DC

## 2015-08-25 MED ORDER — LEVOFLOXACIN 250 MG PO TABS: 250.0000 mg | ORAL_TABLET | Freq: Every day | ORAL | Status: DC

## 2015-08-25 MED ORDER — ALBUTEROL SULFATE (2.5 MG/3ML) 0.083% IN NEBU: 2.5000 mg | INHALATION_SOLUTION | RESPIRATORY_TRACT | Status: DC | PRN

## 2015-08-25 MED ORDER — HYDROCORTISONE 2.5 % RE CREA
TOPICAL_CREAM | Freq: Three times a day (TID) | RECTAL | Status: DC
  Filled 2015-08-25: qty 28.35

## 2015-08-25 NOTE — Progress Notes (Signed)
DISCHARGE NOTE:  Report called to Melody, Charity fundraiser at [employer]	[place]. EMS here to transport pt.

## 2015-08-25 NOTE — Progress Notes (Signed)
Patient is medically stable for D/C to [facility] today. Per Kim admissions coordinator at [facility] patient will go to room 208-B. RN will call report at [phone] and arrange EMS for transport. Clinical Child psychotherapist (CSW) sent D/C Summary, FL2 and D/C Packet to [employer] via [employer] today. Patient is aware of above. CSW contacted patient's daughter Kathie Rhodes and made her aware of above. Please reconsult if future social work needs arise. CSW signing off.   Jetta Lout, LCSW [phone]

## 2015-08-25 NOTE — Discharge Instructions (Signed)
PT at rehab °

## 2015-08-25 NOTE — Consult Note (Signed)
[facility] CLINIC CARDIOLOGY [facility]  CARDIOLOGY CONSULT NOTE  Patient ID: Zoe Hughes MRN: [id number] DOB/AGE: May 16, 1919 82 y.o.  Admit date: 08/22/2015 Referring Physician Dr. Allena Katz Primary Physician   Primary Cardiologist   Reason for Consultation afib  HPI: 82 yo female with history of chronic afib and chronic chf admitted with complaints of vomiting, diarrhea for 3 days. She was noted to have hyponatremia with sodium of 120. Had trivial troponin elevation to 0.10 which appears to be secondary to demand ischemia. No evidence of nstemi. Poor historian.   Review of Systems  Constitutional: Positive for malaise/fatigue.  HENT: Negative.   Eyes: Negative.   Respiratory: Positive for shortness of breath.   Cardiovascular: Negative.   Gastrointestinal: Negative.   Genitourinary: Negative.   Musculoskeletal: Negative.   Neurological: Positive for weakness.  Endo/Heme/Allergies: Negative.   Psychiatric/Behavioral: Positive for memory loss.    Past Medical History  Diagnosis Date  . A-fib (HCC)   . Hypothyroid     History reviewed. No pertinent family history.  Social History   Social History  . Marital Status: Divorced    Spouse Name: N/A  . Number of Children: N/A  . Years of Education: N/A   Occupational History  . Not on file.   Social History Main Topics  . Smoking status: Never Smoker   . Smokeless tobacco: Never Used  . Alcohol Use: No  . Drug Use: No  . Sexual Activity: Not on file   Other Topics Concern  . Not on file   Social History Narrative    Past Surgical History  Procedure Laterality Date  . Cholecystectomy    . Abdominal hysterectomy       Prescriptions prior to admission  Medication Sig Dispense Refill Last Dose  . aspirin EC 81 MG tablet Take 81 mg by mouth every other day.   08/22/2015 at Unknown time  . diltiazem (CARDIZEM CD) 300 MG 24 hr capsule Take 300 mg by mouth daily.   08/22/2015 at Unknown time  . latanoprost  (XALATAN) 0.005 % ophthalmic solution Place 1 drop into the right eye at bedtime.    08/21/2015 at Unknown time  . levothyroxine (SYNTHROID, LEVOTHROID) 75 MCG tablet Take 75 mcg by mouth daily.   08/22/2015 at Unknown time  . loratadine (CLARITIN) 10 MG tablet Take 10 mg by mouth daily as needed. For allergies.   Past Month at Unknown time    Physical Exam: Blood pressure 147/50, pulse 81, temperature 97.9 F (36.6 C), temperature source Oral, resp. rate 20, height  (1.549 m), weight 51.71 kg (114 lb), SpO2 96 %.   Wt Readings from Last 1 Encounters:  08/22/15 51.71 kg (114 lb)     General appearance: alert and cooperative Resp: clear to auscultation bilaterally Cardio: irregularly irregular rhythm GI: soft, non-tender; bowel sounds normal; no masses,  no organomegaly Extremities: extremities normal, atraumatic, no cyanosis or edema Neurologic: Grossly normal  Labs:   Lab Results  Component Value Date   WBC 9.3 08/25/2015   HGB 12.2 08/25/2015   HCT 35.6 08/25/2015   MCV 86.3 08/25/2015   PLT 172 08/25/2015    Recent Labs Lab 08/22/15 1034 08/23/15 0418  08/25/15 0545  NA 120* 124*  < > 128*  K 3.9 4.2  --   --   CL 86* 92*  --   --   CO2 22 24  --   --   BUN 19 18  --   --  CREATININE 0.61 0.65  --  0.57  CALCIUM 8.5* 8.4*  --   --   PROT 7.0  --   --   --   BILITOT 1.2  --   --   --   ALKPHOS 131*  --   --   --   ALT 162*  --   --   --   AST 110*  --   --   --   GLUCOSE 141* 130*  --   --   < > = values in this interval not displayed. Lab Results  Component Value Date   CKTOTAL 347* 08/23/2015   CKMB 9.2* 08/20/2013   TROPONINI 0.10* 08/24/2015      Radiology: small bilateral plerual effusions with possible mild pulmonary edema EKG: afib with variable vr  ASSESSMENT AND PLAN:  82 yo female with history of afib with controlled vr. Mild chf. No evidence of nstemi. Afib appears controlled at present. Would continue with current cardizem for rate control.  Not a candidate for chornic anticoagulaton Signed: Dalia Heading MD, [facility] 08/25/2015, 7:58 AM

## 2015-08-25 NOTE — Clinical Social Work Placement (Signed)
   CLINICAL SOCIAL WORK PLACEMENT  NOTE  Date:  08/25/2015  Patient Details  Name: Zoe Hughes MRN: [id number] Date of Birth: Sep 26, 1918  Clinical Social Work is seeking post-discharge placement for this patient at the Skilled  Nursing Facility level of care (*CSW will initial, date and re-position this form in  chart as items are completed):  Yes   Patient/family provided with [facility] Clinical Social Work Department's list of facilities offering this level of care within the geographic area requested by the patient (or if unable, by the patient's family).  Yes   Patient/family informed of their freedom to choose among providers that offer the needed level of care, that participate in Medicare, Medicaid or managed care program needed by the patient, have an available bed and are willing to accept the patient.  Yes   Patient/family informed of [facility]'s ownership interest in [facility] and [facility], as well as of the fact that they are under no obligation to receive care at these facilities.  PASRR submitted to EDS on       PASRR number received on       Existing PASRR number confirmed on 08/24/15     FL2 transmitted to all facilities in geographic area requested by pt/family on 08/24/15     FL2 transmitted to all facilities within larger geographic area on       Patient informed that his/her managed care company has contracts with or will negotiate with certain facilities, including the following:        Yes   Patient/family informed of bed offers received.  Patient chooses bed at  [facility] )     Physician recommends and patient chooses bed at      Patient to be transferred to  [facility] ) on 08/25/15.  Patient to be transferred to facility by  [facility] )     Patient family notified on 08/25/15 of transfer.  Name of family member notified:   (Patient's daughter Kathie Rhodes is aware of D/C today. )     PHYSICIAN       Additional  Comment:    _______________________________________________ Haig Prophet, LCSW 08/25/2015, 10:47 AM

## 2015-08-25 NOTE — Care Management Important Message (Signed)
Important Message  Patient Details  Name: GRACYNN RAJEWSKI MRN: [id number] Date of Birth: 1919-06-06   Medicare Important Message Given:  Yes    Segundo Makela A, RN 08/25/2015, 8:28 AM

## 2015-08-25 NOTE — Plan of Care (Signed)
Problem: Pain Managment: Goal: General experience of comfort will improve Outcome: Adequate for Discharge No complaints of pain this shift.  Problem: Activity: Goal: Risk for activity intolerance will decrease Outcome: Progressing Pt up in room with minimal assistance

## 2015-08-25 NOTE — Discharge Summary (Addendum)
[facility] [place] at [facility]   PATIENT NAME: Zoe Hughes    MR#:  [id number]  DATE OF BIRTH:  1918-12-04  DATE OF ADMISSION:  08/22/2015 ADMITTING PHYSICIAN: Enedina Finner, MD  DATE OF DISCHARGE: 08/25/15  PRIMARY CARE PHYSICIAN: Clydie Braun, MD    ADMISSION DIAGNOSIS:  Hyponatremia [E87.1]  DISCHARGE DIAGNOSIS:  Acute hyponatremia due to dehydration-improving slowly Pneumonia right LL, CAP Acute on chronic afib  SECONDARY DIAGNOSIS:   Past Medical History  Diagnosis Date  . A-fib (HCC)   . Hypothyroid     HOSPITAL COURSE:  Zoe Hughes is a 82 y.o. female with a known history of chronic A. fib, hypothyroidism comes to the emergency room from home place with nausea vomiting and diarrhea for the last 3 days. Patient reports other sick contacts at home place.   1. ACUTE HYPONATREMIA-Etiology appears GI losses/dehydration -IV fluids, monitor I's and O's, monitor sodium -Regular diet -sodium 120--124--126--128 -gave 1 dose of IV lasix yday  2. History of chronic A. fib continue diltiazem -HR back to baseline. No need for outpt BB   3. Hypothyroidism continue Synthroid  4. DVT prophylaxis subcutaneous Lovenox  5. Right lobar lobe infiltrate, community-acquired with possible mild pulmonary edema and hypoxia -on IV Levaquin Blood cultures negative -pt has h/o asthma -prn nebs -pt will discharge on oxygen and will be weaned off at rehab   6.right toe bruise after fall -xray right foot no acute trauma  D/c to [facility] today. D/w Daughter Kathie Rhodes and agreeable  CONSULTS OBTAINED:  Treatment Team:  Dalia Heading, MD  DRUG ALLERGIES:   Allergies  Allergen Reactions  . Clarithromycin Diarrhea  . Aspirin Other (See Comments)    Other reaction(s): Blood Disorder-thin blood too much when patient takes too much or high doses of aspirin.  . Tetanus Toxoid Rash    DISCHARGE MEDICATIONS:   Current Discharge Medication List     START taking these medications   Details  albuterol (PROVENTIL) (2.5 MG/3ML) 0.083% nebulizer solution Take 3 mLs (2.5 mg total) by nebulization every 4 (four) hours as needed for wheezing or shortness of breath. Qty: 75 mL, Refills: 12    hydrocortisone (ANUSOL-HC) 2.5 % rectal cream Place rectally 3 (three) times daily. Qty: 30 g, Refills: 0    levofloxacin (LEVAQUIN) 250 MG tablet Take 1 tablet (250 mg total) by mouth daily. Qty: 4 tablet, Refills: 0      CONTINUE these medications which have NOT CHANGED   Details  aspirin EC 81 MG tablet Take 81 mg by mouth every other day.    diltiazem (CARDIZEM CD) 300 MG 24 hr capsule Take 300 mg by mouth daily.    latanoprost (XALATAN) 0.005 % ophthalmic solution Place 1 drop into the right eye at bedtime.     levothyroxine (SYNTHROID, LEVOTHROID) 75 MCG tablet Take 75 mcg by mouth daily.    loratadine (CLARITIN) 10 MG tablet Take 10 mg by mouth daily as needed. For allergies.        If you experience worsening of your admission symptoms, develop shortness of breath, life threatening emergency, suicidal or homicidal thoughts you must seek medical attention immediately by calling 911 or calling your MD immediately  if symptoms less severe.  You Must read complete instructions/literature along with all the possible adverse reactions/side effects for all the Medicines you take and that have been prescribed to you. Take any new Medicines after you have completely understood and accept all the possible adverse reactions/side  effects.   Please note  You were cared for by a hospitalist during your hospital stay. If you have any questions about your discharge medications or the care you received while you were in the hospital after you are discharged, you can call the unit and asked to speak with the hospitalist on call if the hospitalist that took care of you is not available. Once you are discharged, your primary care physician will handle any  further medical issues. Please note that NO REFILLS for any discharge medications will be authorized once you are discharged, as it is imperative that you return to your primary care physician (or establish a relationship with a primary care physician if you do not have one) for your aftercare needs so that they can reassess your need for medications and monitor your lab values. Today   SUBJECTIVE   Doing well. Complains of hemorrhoidal pain.  VITAL SIGNS:  Blood pressure 147/50, pulse 81, temperature 97.9 F (36.6 C), temperature source Oral, resp. rate 20, height 5\' 1"  (1.549 m), weight 51.71 kg (114 lb), SpO2 96 %.  I/O:   Intake/Output Summary (Last 24 hours) at 08/25/15 0911 Last data filed at 08/25/15 0715  Gross per 24 hour  Intake    360 ml  Output    950 ml  Net   -590 ml    PHYSICAL EXAMINATION:  GENERAL:  82 y.o.-year-old patient lying in the bed with no acute distress.  EYES: Pupils equal, round, reactive to light and accommodation. No scleral icterus. Extraocular muscles intact.  HEENT: Head atraumatic, normocephalic. Oropharynx and nasopharynx clear.  NECK:  Supple, no jugular venous distention. No thyroid enlargement, no tenderness.  LUNGS: Decreased breath sounds bilaterally, no wheezing, rales,rhonchi or crepitation. No use of accessory muscles of respiration.  CARDIOVASCULAR: S1, S2 normal. No murmurs, rubs, or gallops.  ABDOMEN: Soft, non-tender, non-distended. Bowel sounds present. No organomegaly or mass.  EXTREMITIES: No pedal edema, cyanosis, or clubbing.  NEUROLOGIC: Cranial nerves II through XII are intact. Muscle strength 5/5 in all extremities. Sensation intact. Gait not checked.  PSYCHIATRIC: The patient is alert and oriented x 3.  SKIN: No obvious rash, lesion, or ulcer.   DATA REVIEW:   CBC   Recent Labs Lab 08/25/15 0545  WBC 9.3  HGB 12.2  HCT 35.6  PLT 172    Chemistries   Recent Labs Lab 08/22/15 1034 08/23/15 0418  08/25/15 0545   NA 120* 124*  < > 128*  K 3.9 4.2  --   --   CL 86* 92*  --   --   CO2 22 24  --   --   GLUCOSE 141* 130*  --   --   BUN 19 18  --   --   CREATININE 0.61 0.65  --  0.57  CALCIUM 8.5* 8.4*  --   --   AST 110*  --   --   --   ALT 162*  --   --   --   ALKPHOS 131*  --   --   --   BILITOT 1.2  --   --   --   < > = values in this interval not displayed.  Microbiology Results   Recent Results (from the past 240 hour(s))  MRSA PCR Screening     Status: None   Collection Time: 08/22/15  5:23 PM  Result Value Ref Range Status   MRSA by PCR NEGATIVE NEGATIVE Final    Comment:  The GeneXpert MRSA Assay (FDA approved for NASAL specimens only), is one component of a comprehensive MRSA colonization surveillance program. It is not intended to diagnose MRSA infection nor to guide or monitor treatment for MRSA infections.   CULTURE, BLOOD (ROUTINE X 2) w Reflex to PCR ID Panel     Status: None (Preliminary result)   Collection Time: 08/23/15  2:36 PM  Result Value Ref Range Status   Specimen Description BLOOD LEFT AC  Final   Special Requests BOTTLES DRAWN AEROBIC AND ANAEROBIC  Final   Culture NO GROWTH < 24 HOURS  Final   Report Status PENDING  Incomplete  CULTURE, BLOOD (ROUTINE X 2) w Reflex to PCR ID Panel     Status: None (Preliminary result)   Collection Time: 08/23/15  2:36 PM  Result Value Ref Range Status   Specimen Description BLOOD RIGHT AC  Final   Special Requests   Final    BOTTLES DRAWN AEROBIC AND ANAEROBIC ANA AER   Culture NO GROWTH < 24 HOURS  Final   Report Status PENDING  Incomplete    RADIOLOGY:  Dg Chest 1 View  08/23/2015  CLINICAL DATA:  Shortness of breath, cough EXAM: CHEST 1 VIEW COMPARISON:  10/09/2013 FINDINGS: Cardiomegaly. Bilateral lower lobe airspace opacities are noted with small bilateral effusions. Mild vascular congestion and interstitial prominence. No acute bony abnormality. Hyperinflation of the lungs compatible with  COPD. IMPRESSION: COPD, cardiomegaly. Vascular congestion with interstitial prominence, likely interstitial edema. Bibasilar opacities could reflect edema or atelectasis. Small effusions. Electronically Signed   By: Charlett Nose M.D.   On: 08/23/2015 12:56   Dg Chest Port 1 View  08/23/2015  CLINICAL DATA:  Acute onset of shortness of breath. Initial encounter. EXAM: PORTABLE CHEST 1 VIEW COMPARISON:  Chest radiograph performed earlier today at 12:39 p.m. FINDINGS: The lungs are well-aerated. Small bilateral pleural effusions are noted. Bibasilar and bilateral central airspace opacities may reflect mild pulmonary edema. No pneumothorax is seen. The cardiomediastinal silhouette is mildly enlarged. No acute osseous abnormalities are seen. IMPRESSION: Small bilateral pleural effusions noted. Bibasilar and bilateral central airspace opacities may reflect mild pulmonary edema. Mild cardiomegaly noted. Electronically Signed   By: Roanna Raider M.D.   On: 08/23/2015 21:54   Dg Foot 2 Views Right  08/23/2015  CLINICAL DATA:  Right foot pain.  No known injury. EXAM: RIGHT FOOT - 2 VIEW COMPARISON:  None. FINDINGS: Moderate degenerative changes and hallux valgus at the first MTP joint. No acute bony abnormality. Specifically, no fracture, subluxation, or dislocation. Soft tissues are intact. IMPRESSION: No acute bony abnormality. Electronically Signed   By: Charlett Nose M.D.   On: 08/23/2015 10:38     Management plans discussed with the patient, family and they are in agreement.  CODE STATUS:     Code Status Orders        Start     Ordered   08/22/15 1651  Full code   Continuous     08/22/15 1650    Code Status History    Date Active Date Inactive Code Status Order ID Comments User Context   This patient has a current code status but no historical code status.    Advance Directive Documentation        Most Recent Value   Type of Advance Directive  Healthcare Power of Attorney   Pre-existing out  of facility DNR order (yellow form or pink MOST form)     "MOST" Form in  Place?        TOTAL TIME TAKING CARE OF THIS PATIENT: 40 minutes.    Lindley Hiney M.D on 08/25/2015 at 9:11 AM  Between 7am to 6pm - Pager - [phone] After 6pm go to [email or web address] - password [email or web address] [facility]  [facility]  Office  [phone]  CC: Primary care physician; Clydie Braun, MD

## 2015-08-28 LAB — CULTURE, BLOOD (ROUTINE X 2)
Culture: NO GROWTH
Culture: NO GROWTH

## 2015-09-01 ENCOUNTER — Encounter
Admission: RE | Admit: 2015-09-01 | Discharge: 2015-09-01 | Disposition: A | Discharge status: Home / Self Care | Payer: Medicare Other | Source: Ambulatory Visit | Attending: Internal Medicine | Admitting: Internal Medicine

## 2015-09-01 DIAGNOSIS — R3915 Urgency of urination: Secondary | ICD-10-CM | POA: Insufficient documentation

## 2015-09-01 DIAGNOSIS — R350 Frequency of micturition: Secondary | ICD-10-CM | POA: Insufficient documentation

## 2015-09-01 DIAGNOSIS — R410 Disorientation, unspecified: Secondary | ICD-10-CM | POA: Insufficient documentation

## 2015-09-04 DIAGNOSIS — R410 Disorientation, unspecified: Secondary | ICD-10-CM | POA: Diagnosis not present

## 2015-09-04 DIAGNOSIS — R3915 Urgency of urination: Secondary | ICD-10-CM | POA: Diagnosis present

## 2015-09-04 DIAGNOSIS — R350 Frequency of micturition: Secondary | ICD-10-CM | POA: Diagnosis present

## 2015-09-04 LAB — URINALYSIS COMPLETE WITH MICROSCOPIC (ARMC ONLY)
Bilirubin Urine: NEGATIVE
Glucose, UA: NEGATIVE mg/dL
Ketones, ur: NEGATIVE mg/dL
Nitrite: POSITIVE — AB
PH: 7 (ref 5.0–8.0)
PROTEIN: 30 mg/dL — AB
Specific Gravity, Urine: 1.01 (ref 1.005–1.030)

## 2015-09-06 LAB — URINE CULTURE: Culture: >=100000

## 2015-09-09 ENCOUNTER — Emergency Department: Payer: Medicare Other

## 2015-09-09 ENCOUNTER — Emergency Department
Admission: EM | Admit: 2015-09-09 | Discharge: 2015-09-09 | Disposition: A | Discharge status: Home / Self Care | Payer: Medicare Other | Attending: Student | Admitting: Student

## 2015-09-09 ENCOUNTER — Encounter: Payer: Self-pay | Admitting: Emergency Medicine

## 2015-09-09 DIAGNOSIS — W01198A Fall on same level from slipping, tripping and stumbling with subsequent striking against other object, initial encounter: Secondary | ICD-10-CM | POA: Insufficient documentation

## 2015-09-09 DIAGNOSIS — Z7982 Long term (current) use of aspirin: Secondary | ICD-10-CM | POA: Insufficient documentation

## 2015-09-09 DIAGNOSIS — I10 Essential (primary) hypertension: Secondary | ICD-10-CM | POA: Insufficient documentation

## 2015-09-09 DIAGNOSIS — Y998 Other external cause status: Secondary | ICD-10-CM | POA: Insufficient documentation

## 2015-09-09 DIAGNOSIS — S52592A Other fractures of lower end of left radius, initial encounter for closed fracture: Secondary | ICD-10-CM | POA: Insufficient documentation

## 2015-09-09 DIAGNOSIS — Y9389 Activity, other specified: Secondary | ICD-10-CM | POA: Insufficient documentation

## 2015-09-09 DIAGNOSIS — Z792 Long term (current) use of antibiotics: Secondary | ICD-10-CM | POA: Diagnosis not present

## 2015-09-09 DIAGNOSIS — S62102A Fracture of unspecified carpal bone, left wrist, initial encounter for closed fracture: Secondary | ICD-10-CM

## 2015-09-09 DIAGNOSIS — Y92129 Unspecified place in nursing home as the place of occurrence of the external cause: Secondary | ICD-10-CM | POA: Diagnosis not present

## 2015-09-09 DIAGNOSIS — S6992XA Unspecified injury of left wrist, hand and finger(s), initial encounter: Secondary | ICD-10-CM | POA: Diagnosis present

## 2015-09-09 DIAGNOSIS — W19XXXA Unspecified fall, initial encounter: Secondary | ICD-10-CM

## 2015-09-09 HISTORY — DX: Essential (primary) hypertension: I10

## 2015-09-09 LAB — BASIC METABOLIC PANEL
ANION GAP: 11 (ref 5–15)
BUN: 23 mg/dL — AB (ref 6–20)
CALCIUM: 8.9 mg/dL (ref 8.9–10.3)
CO2: 21 mmol/L — AB (ref 22–32)
Chloride: 99 mmol/L — ABNORMAL LOW (ref 101–111)
Creatinine, Ser: 1 mg/dL (ref 0.44–1.00)
GFR calc Af Amer: 54 mL/min — ABNORMAL LOW (ref >60)
GFR calc non Af Amer: 46 mL/min — ABNORMAL LOW (ref >60)
GLUCOSE: 121 mg/dL — AB (ref 65–99)
Potassium: 4.7 mmol/L (ref 3.5–5.1)
Sodium: 131 mmol/L — ABNORMAL LOW (ref 135–145)

## 2015-09-09 LAB — CBC
HCT: 43.2 % (ref 35.0–47.0)
HEMOGLOBIN: 14.4 g/dL (ref 12.0–16.0)
MCH: 29.2 pg (ref 26.0–34.0)
MCHC: 33.3 g/dL (ref 32.0–36.0)
MCV: 87.8 fL (ref 80.0–100.0)
Platelets: 297 10*3/uL (ref 150–440)
RBC: 4.92 MIL/uL (ref 3.80–5.20)
RDW: 14.5 % (ref 11.5–14.5)
WBC: 9.9 10*3/uL (ref 3.6–11.0)

## 2015-09-09 MED ORDER — ACETAMINOPHEN 500 MG PO TABS: 500.0000 mg | ORAL_TABLET | Freq: Four times a day (QID) | ORAL | Status: DC | PRN

## 2015-09-09 NOTE — ED Notes (Signed)
Ambulated to BR with minimal assist.  Tolerated well. 

## 2015-09-09 NOTE — ED Provider Notes (Signed)
[facility] Emergency Department Provider Note  ____________________________________________  Time seen: Approximately 5:50 PM  I have reviewed the triage vital signs and the nursing notes.   HISTORY  Chief Complaint Fall and Wrist Pain    HPI Zoe Hughes is a 82 y.o. female with history of atrial fibrillation on aspirin, hypothyroidism, hypertension who presents for evaluation of mechanical fall with wrist injury which occurred just prior to arrival today, sudden onset, pain has been constant since onset and is worse with movement. Patient was at her living facility and was attempting to bend over to pick something up off the floor when she lost balance and fell backwards onto the left wrist. She was unsure whether not she hit her head but she did not lose consciousness. She is not complaining of any neck pain. She denies any other pain complaints. She was hospitalized at [facility] last month for pneumonia and hyponatremia however has been recovering well. That last year she had several falls however she has not had any other falls recently. No cough, sneezing, vomiting, diarrhea, fevers or chills. No chest pain or difficulty breathing. No lightheadedness, no numbness or weakness. She typically ambulates with a walker at baseline.   Past Medical History  Diagnosis Date  . A-fib (HCC)   . Hypothyroid   . Hypertension     Patient Active Problem List   Diagnosis Date Noted  . Hyponatremia 08/22/2015    Past Surgical History  Procedure Laterality Date  . Cholecystectomy    . Abdominal hysterectomy      Current Outpatient Rx  Name  Route  Sig  Dispense  Refill  . ALPRAZolam (XANAX) 0.25 MG tablet   Oral   Take 0.25 mg by mouth 2 (two) times daily as needed for anxiety.         . clotrimazole-betamethasone (LOTRISONE) cream   Topical   Apply 1 application topically 2 (two) times daily.         Marland Kitchen diltiazem (DILACOR XR) 240  MG 24 hr capsule   Oral   Take 240 mg by mouth daily.         Marland Kitchen latanoprost (XALATAN) 0.005 % ophthalmic solution   Right Eye   Place 1 drop into the right eye at bedtime.          Marland Kitchen levothyroxine (SYNTHROID, LEVOTHROID) 50 MCG tablet   Oral   Take 50 mcg by mouth daily.         Marland Kitchen albuterol (PROVENTIL) (2.5 MG/3ML) 0.083% nebulizer solution   Nebulization   Take 3 mLs (2.5 mg total) by nebulization every 4 (four) hours as needed for wheezing or shortness of breath.   75 mL   12   . aspirin EC 81 MG tablet   Oral   Take 81 mg by mouth every other day.         . diltiazem (CARDIZEM CD) 300 MG 24 hr capsule   Oral   Take 300 mg by mouth daily.         . hydrocortisone (ANUSOL-HC) 2.5 % rectal cream   Rectal   Place rectally 3 (three) times daily.   30 g   0   . levofloxacin (LEVAQUIN) 250 MG tablet   Oral   Take 1 tablet (250 mg total) by mouth daily.   4 tablet   0   . levothyroxine (SYNTHROID, LEVOTHROID) 75 MCG tablet   Oral   Take 75 mcg by mouth daily.         Marland Kitchen  EXPIRED: loratadine (CLARITIN) 10 MG tablet   Oral   Take 10 mg by mouth daily as needed. For allergies.           Allergies Clarithromycin; Aspirin; and Tetanus toxoid  No family history on file.  Social History Social History  Substance Use Topics  . Smoking status: Never Smoker   . Smokeless tobacco: Never Used  . Alcohol Use: No    Review of Systems Constitutional: No fever/chills Eyes: No visual changes. ENT: No sore throat. Cardiovascular: Denies chest pain. Respiratory: Denies shortness of breath. Gastrointestinal: No abdominal pain.  No nausea, no vomiting.  No diarrhea.  No constipation. Genitourinary: Negative for dysuria. Musculoskeletal: Negative for back pain. Skin: Negative for rash. Neurological: Negative for headaches, focal weakness or numbness.  10-point ROS otherwise negative.  ____________________________________________   PHYSICAL EXAM:  VITAL  SIGNS: ED Triage Vitals  Enc Vitals Group     BP 09/09/15 1628 121/56 mmHg     Pulse Rate 09/09/15 1628 64     Resp 09/09/15 1628 16     Temp 09/09/15 1628 97.7 F (36.5 C)     Temp Source 09/09/15 1628 Oral     SpO2 09/09/15 1628 97 %     Weight 09/09/15 1628 114 lb (51.71 kg)     Height 09/09/15 1628  (1.575 m)     Head Cir --      Peak Flow --      Pain Score 09/09/15 1629 6     Pain Loc --      Pain Edu? --      Excl. in GC? --     Constitutional: Alert and oriented. Well appearing and in no acute distress. Eyes: Conjunctivae are normal. PERRL. EOMI. Head: Atraumatic. Nose: No congestion/rhinnorhea. Mouth/Throat: Mucous membranes are moist.  Oropharynx non-erythematous. Neck: No stridor.  No cervical spine tenderness to palpation. Cardiovascular: Normal rate, irregular rhythm. Grossly normal heart sounds.  Good peripheral circulation. Respiratory: Normal respiratory effort.  No retractions. Lungs CTAB. Gastrointestinal: Soft and nontender. No distention.  No CVA tenderness. Genitourinary: deferred Musculoskeletal: No lower extremity tenderness nor edema.  No joint effusions. Obvious swelling and tenderness associated with the left wrist however the left radial, ulnar, medial nerve are intact in the patient has 2+ left radial pulse. No midline T or L-spine tenderness to palpation. Pelvis is stable to rock and compression. Full painless active range of motion of bilateral hip joints. Neurologic:  Normal speech and language. No gross focal neurologic deficits are appreciated. No gait instability. Skin:  Skin is warm, dry and intact. No rash noted. Psychiatric: Mood and affect are normal. Speech and behavior are normal.  ____________________________________________   LABS (all labs ordered are listed, but only abnormal results are displayed)  Labs Reviewed  BASIC METABOLIC PANEL - Abnormal; Notable for the following:    Sodium 131 (*)    Chloride 99 (*)    CO2 21 (*)     Glucose, Bld 121 (*)    BUN 23 (*)    GFR calc non Af Amer 46 (*)    GFR calc Af Amer 54 (*)    All other components within normal limits  CBC  URINALYSIS COMPLETEWITH MICROSCOPIC ([facility] ONLY)   ____________________________________________  EKG  ED ECG REPORT I, Gayla Doss, the attending physician, personally viewed and interpreted this ECG.   Date: 09/09/2015  EKG Time: 16:26  Rate: 81  Rhythm: atrial fibrillation, rate 81  Axis: left  Intervals:none  ST&T Change: No acute ST elevation. Q-wave in V2. Nonspecific T-wave abnormality in the septal leads.  ____________________________________________  RADIOLOGY  CT head IMPRESSION: Atrophy and chronic microvascular ischemia. No acute intracranial Abnormality.  Xray left wrist  IMPRESSION: Angulated distal LEFT radial metaphysis heel fracture.  Associated soft tissue swelling and deformity.  Questionable chondrocalcinosis versus avulsion fragment at dorsal margin of triquetrum. ____________________________________________   PROCEDURES  Procedure(s) performed: None  Critical Care performed: No  ____________________________________________   INITIAL IMPRESSION / ASSESSMENT AND PLAN / ED COURSE  Pertinent labs & imaging results that were available during my care of the patient were reviewed by me and considered in my medical decision making (see chart for details).  Rosalene Billingsauline B Burbridge is a 82 y.o. female with history of atrial fibrillation on aspirin, hypothyroidism, hypertension who presents for evaluation of mechanical fall with wrist injury which occurred just prior to arrival today. On exam, she is very well-appearing and in no acute distress. Vital signs stable, she is afebrile. Her exam is only notable for obvious swelling associated with the left wrist but she is neurovascularly intact. Labs reviewed are notable for mild chronic stable hyponatremia with sodium 131. Normal CBC. CT head was negative for any  acute intracranial process. Plain films of the left wrist confirm a distal radius fracture. I discussed the case with Dr. Martha ClanKrasinski or Ortho who has reviewed the x-rays. He recommends sugar tong splint and the patient will follow-up with him in clinic. I discussed this with the patient as well as return precautions, need for close follow-up and she is comfortable with the discharge plan. DC home. ____________________________________________   FINAL CLINICAL IMPRESSION(S) / ED DIAGNOSES  Final diagnoses:  Fall, initial encounter  Wrist fracture, closed, left, initial encounter      Gayla DossEryka A Jatorian Renault, MD 09/09/15 [phone]

## 2015-09-09 NOTE — ED Notes (Signed)
Pt given ice pack for left wrist 

## 2015-09-09 NOTE — ED Notes (Signed)
Pt here after fall today; reports she was bent over picking something up out of the floor and fell back the otherway, hitting the back of her head. Pt denies LOC, is A&O in triage room. Pt reports she's been falling a lot lately, reports pain and swelling to left wrist.

## 2015-09-09 NOTE — ED Notes (Signed)
AAOx3.  Skin warm and dry.  NAD 

## 2015-10-23 ENCOUNTER — Emergency Department: Payer: Medicare Other

## 2015-10-23 ENCOUNTER — Encounter: Payer: Self-pay | Admitting: Emergency Medicine

## 2015-10-23 ENCOUNTER — Inpatient Hospital Stay
Admission: EM | Admit: 2015-10-23 | Discharge: 2015-10-27 | DRG: 871 | Disposition: A | Discharge status: Home Health | Payer: Medicare Other | Attending: Internal Medicine | Admitting: Internal Medicine

## 2015-10-23 DIAGNOSIS — E039 Hypothyroidism, unspecified: Secondary | ICD-10-CM | POA: Diagnosis present

## 2015-10-23 DIAGNOSIS — Z79899 Other long term (current) drug therapy: Secondary | ICD-10-CM

## 2015-10-23 DIAGNOSIS — Y95 Nosocomial condition: Secondary | ICD-10-CM | POA: Diagnosis present

## 2015-10-23 DIAGNOSIS — I482 Chronic atrial fibrillation: Secondary | ICD-10-CM | POA: Diagnosis present

## 2015-10-23 DIAGNOSIS — J440 Chronic obstructive pulmonary disease with acute lower respiratory infection: Secondary | ICD-10-CM | POA: Diagnosis present

## 2015-10-23 DIAGNOSIS — R0602 Shortness of breath: Secondary | ICD-10-CM

## 2015-10-23 DIAGNOSIS — F419 Anxiety disorder, unspecified: Secondary | ICD-10-CM | POA: Diagnosis present

## 2015-10-23 DIAGNOSIS — Z7982 Long term (current) use of aspirin: Secondary | ICD-10-CM | POA: Diagnosis not present

## 2015-10-23 DIAGNOSIS — E876 Hypokalemia: Secondary | ICD-10-CM | POA: Diagnosis present

## 2015-10-23 DIAGNOSIS — Z887 Allergy status to serum and vaccine status: Secondary | ICD-10-CM

## 2015-10-23 DIAGNOSIS — A419 Sepsis, unspecified organism: Principal | ICD-10-CM | POA: Diagnosis present

## 2015-10-23 DIAGNOSIS — J189 Pneumonia, unspecified organism: Secondary | ICD-10-CM | POA: Diagnosis not present

## 2015-10-23 DIAGNOSIS — Z8701 Personal history of pneumonia (recurrent): Secondary | ICD-10-CM | POA: Diagnosis not present

## 2015-10-23 DIAGNOSIS — J441 Chronic obstructive pulmonary disease with (acute) exacerbation: Secondary | ICD-10-CM | POA: Diagnosis present

## 2015-10-23 DIAGNOSIS — I10 Essential (primary) hypertension: Secondary | ICD-10-CM | POA: Diagnosis present

## 2015-10-23 DIAGNOSIS — E872 Acidosis: Secondary | ICD-10-CM | POA: Diagnosis present

## 2015-10-23 DIAGNOSIS — R05 Cough: Secondary | ICD-10-CM

## 2015-10-23 DIAGNOSIS — R059 Cough, unspecified: Secondary | ICD-10-CM

## 2015-10-23 DIAGNOSIS — I509 Heart failure, unspecified: Secondary | ICD-10-CM

## 2015-10-23 LAB — CBC WITH DIFFERENTIAL/PLATELET
Basophils Absolute: 0 10*3/uL (ref 0–0.1)
Basophils Relative: 0 %
EOS ABS: 0 10*3/uL (ref 0–0.7)
EOS PCT: 0 %
HCT: 39.6 % (ref 35.0–47.0)
HEMOGLOBIN: 12.9 g/dL (ref 12.0–16.0)
LYMPHS ABS: 0.4 10*3/uL — AB (ref 1.0–3.6)
Lymphocytes Relative: 3 %
MCH: 29 pg (ref 26.0–34.0)
MCHC: 32.6 g/dL (ref 32.0–36.0)
MCV: 88.9 fL (ref 80.0–100.0)
MONOS PCT: 4 %
Monocytes Absolute: 0.5 10*3/uL (ref 0.2–0.9)
Neutro Abs: 10.3 10*3/uL — ABNORMAL HIGH (ref 1.4–6.5)
Neutrophils Relative %: 93 %
PLATELETS: 185 10*3/uL (ref 150–440)
RBC: 4.46 MIL/uL (ref 3.80–5.20)
RDW: 15.1 % — ABNORMAL HIGH (ref 11.5–14.5)
WBC: 11.2 10*3/uL — ABNORMAL HIGH (ref 3.6–11.0)

## 2015-10-23 LAB — URINALYSIS COMPLETE WITH MICROSCOPIC (ARMC ONLY)
Bilirubin Urine: NEGATIVE
Glucose, UA: >500 mg/dL — AB
HGB URINE DIPSTICK: NEGATIVE
Ketones, ur: NEGATIVE mg/dL
Nitrite: NEGATIVE
PH: 5 (ref 5.0–8.0)
PROTEIN: 100 mg/dL — AB
SPECIFIC GRAVITY, URINE: 1.012 (ref 1.005–1.030)

## 2015-10-23 LAB — BASIC METABOLIC PANEL
Anion gap: 11 (ref 5–15)
BUN: 16 mg/dL (ref 6–20)
CHLORIDE: 98 mmol/L — AB (ref 101–111)
CO2: 23 mmol/L (ref 22–32)
CREATININE: 0.71 mg/dL (ref 0.44–1.00)
Calcium: 8.6 mg/dL — ABNORMAL LOW (ref 8.9–10.3)
GFR calc Af Amer: >60 mL/min (ref >60)
GFR calc non Af Amer: >60 mL/min (ref >60)
GLUCOSE: 241 mg/dL — AB (ref 65–99)
Potassium: 3.8 mmol/L (ref 3.5–5.1)
SODIUM: 132 mmol/L — AB (ref 135–145)

## 2015-10-23 LAB — LACTIC ACID, PLASMA
LACTIC ACID, VENOUS: 2.1 mmol/L — AB (ref 0.5–2.0)
Lactic Acid, Venous: 1.4 mmol/L (ref 0.5–2.0)

## 2015-10-23 LAB — TROPONIN I: Troponin I: 0.06 ng/mL — ABNORMAL HIGH (ref <0.031)

## 2015-10-23 MED ORDER — METHYLPREDNISOLONE SODIUM SUCC 125 MG IJ SOLR
60.0000 mg | Freq: Four times a day (QID) | INTRAMUSCULAR | Status: DC
  Administered 2015-10-23: 60 mg via INTRAVENOUS

## 2015-10-23 MED ORDER — ONDANSETRON HCL 4 MG PO TABS: 4.0000 mg | ORAL_TABLET | Freq: Four times a day (QID) | ORAL | Status: DC | PRN

## 2015-10-23 MED ORDER — SODIUM CHLORIDE 0.9% FLUSH
3.0000 mL | Freq: Two times a day (BID) | INTRAVENOUS | Status: DC
  Administered 2015-10-23 – 2015-10-27 (×7): 3 mL via INTRAVENOUS

## 2015-10-23 MED ORDER — DEXTROSE 5 % IV SOLN
2.0000 g | Freq: Once | INTRAVENOUS | Status: AC
  Administered 2015-10-23: 2 g via INTRAVENOUS
  Filled 2015-10-23: qty 2

## 2015-10-23 MED ORDER — DILTIAZEM HCL 25 MG/5ML IV SOLN
15.0000 mg | Freq: Once | INTRAVENOUS | Status: AC
  Administered 2015-10-23: 15 mg via INTRAVENOUS
  Filled 2015-10-23: qty 5

## 2015-10-23 MED ORDER — SODIUM CHLORIDE 0.9 % IV BOLUS (SEPSIS)
1000.0000 mL | Freq: Once | INTRAVENOUS | Status: AC
  Administered 2015-10-23: 1000 mL via INTRAVENOUS

## 2015-10-23 MED ORDER — LEVOTHYROXINE SODIUM 88 MCG PO TABS
88.0000 ug | ORAL_TABLET | Freq: Every day | ORAL | Status: DC
  Administered 2015-10-24 – 2015-10-27 (×4): 88 ug via ORAL
  Filled 2015-10-23 (×4): qty 1

## 2015-10-23 MED ORDER — IPRATROPIUM-ALBUTEROL 0.5-2.5 (3) MG/3ML IN SOLN
3.0000 mL | Freq: Four times a day (QID) | RESPIRATORY_TRACT | Status: DC
  Administered 2015-10-23 – 2015-10-27 (×10): 3 mL via RESPIRATORY_TRACT
  Filled 2015-10-23 (×11): qty 3

## 2015-10-23 MED ORDER — VANCOMYCIN HCL IN DEXTROSE 1-5 GM/200ML-% IV SOLN
1000.0000 mg | Freq: Once | INTRAVENOUS | Status: AC
  Administered 2015-10-23: 1000 mg via INTRAVENOUS
  Filled 2015-10-23: qty 200

## 2015-10-23 MED ORDER — ACETAMINOPHEN 325 MG PO TABS: 650.0000 mg | ORAL_TABLET | Freq: Four times a day (QID) | ORAL | Status: DC | PRN

## 2015-10-23 MED ORDER — ASPIRIN EC 81 MG PO TBEC
81.0000 mg | DELAYED_RELEASE_TABLET | ORAL | Status: DC
  Administered 2015-10-25 – 2015-10-27 (×2): 81 mg via ORAL
  Filled 2015-10-23 (×2): qty 1

## 2015-10-23 MED ORDER — SODIUM CHLORIDE 0.9 % IV SOLN: 250.0000 mL | INTRAVENOUS | Status: DC | PRN

## 2015-10-23 MED ORDER — LATANOPROST 0.005 % OP SOLN
1.0000 [drp] | Freq: Every day | OPHTHALMIC | Status: DC
  Administered 2015-10-25 – 2015-10-26 (×3): 1 [drp] via OPHTHALMIC
  Filled 2015-10-23: qty 2.5

## 2015-10-23 MED ORDER — ACETAMINOPHEN 500 MG PO TABS: 500.0000 mg | ORAL_TABLET | Freq: Four times a day (QID) | ORAL | Status: DC | PRN

## 2015-10-23 MED ORDER — LORATADINE 10 MG PO TABS
10.0000 mg | ORAL_TABLET | Freq: Every day | ORAL | Status: DC
  Administered 2015-10-23 – 2015-10-27 (×5): 10 mg via ORAL
  Filled 2015-10-23 (×5): qty 1

## 2015-10-23 MED ORDER — PNEUMOCOCCAL VAC POLYVALENT 25 MCG/0.5ML IJ INJ
0.5000 mL | INJECTION | INTRAMUSCULAR | Status: DC
  Filled 2015-10-23: qty 0.5

## 2015-10-23 MED ORDER — ALPRAZOLAM 0.25 MG PO TABS: 0.2500 mg | ORAL_TABLET | Freq: Two times a day (BID) | ORAL | Status: DC | PRN

## 2015-10-23 MED ORDER — SODIUM CHLORIDE 0.9% FLUSH: 3.0000 mL | INTRAVENOUS | Status: DC | PRN

## 2015-10-23 MED ORDER — ENOXAPARIN SODIUM 40 MG/0.4ML ~~LOC~~ SOLN
40.0000 mg | SUBCUTANEOUS | Status: DC
Start: 2015-10-23 — End: 2015-10-23

## 2015-10-23 MED ORDER — METHYLPREDNISOLONE SODIUM SUCC 125 MG IJ SOLR
INTRAMUSCULAR | Status: AC
  Administered 2015-10-23: 23:00:00
  Filled 2015-10-23: qty 2

## 2015-10-23 MED ORDER — DILTIAZEM HCL ER COATED BEADS 180 MG PO CP24
300.0000 mg | ORAL_CAPSULE | Freq: Every day | ORAL | Status: DC
  Administered 2015-10-24 – 2015-10-27 (×4): 300 mg via ORAL
  Filled 2015-10-23 (×5): qty 1

## 2015-10-23 MED ORDER — ENOXAPARIN SODIUM 40 MG/0.4ML ~~LOC~~ SOLN
40.0000 mg | SUBCUTANEOUS | Status: DC
  Administered 2015-10-24 – 2015-10-26 (×3): 40 mg via SUBCUTANEOUS
  Filled 2015-10-23 (×3): qty 0.4

## 2015-10-23 MED ORDER — ONDANSETRON HCL 4 MG/2ML IJ SOLN: 4.0000 mg | Freq: Four times a day (QID) | INTRAMUSCULAR | Status: DC | PRN

## 2015-10-23 MED ORDER — DEXTROSE 5 % IV SOLN
1.0000 g | INTRAVENOUS | Status: DC
  Administered 2015-10-24: 1 g via INTRAVENOUS
  Filled 2015-10-23 (×2): qty 1

## 2015-10-23 MED ORDER — SODIUM CHLORIDE 0.9% FLUSH
3.0000 mL | Freq: Two times a day (BID) | INTRAVENOUS | Status: DC
  Administered 2015-10-23 – 2015-10-27 (×8): 3 mL via INTRAVENOUS

## 2015-10-23 MED ORDER — METHYLPREDNISOLONE SODIUM SUCC 40 MG IJ SOLR
40.0000 mg | Freq: Two times a day (BID) | INTRAMUSCULAR | Status: DC
  Administered 2015-10-24 (×2): 40 mg via INTRAVENOUS
  Filled 2015-10-23 (×2): qty 1

## 2015-10-23 MED ORDER — ACETAMINOPHEN 650 MG RE SUPP: 650.0000 mg | Freq: Four times a day (QID) | RECTAL | Status: DC | PRN

## 2015-10-23 MED ORDER — SODIUM CHLORIDE 0.9 % IV BOLUS (SEPSIS)
500.0000 mL | INTRAVENOUS | Status: AC
  Administered 2015-10-23: 5000 mL via INTRAVENOUS

## 2015-10-23 MED ORDER — VANCOMYCIN HCL 500 MG IV SOLR
500.0000 mg | INTRAVENOUS | Status: DC
  Administered 2015-10-24 – 2015-10-26 (×3): 500 mg via INTRAVENOUS
  Filled 2015-10-23 (×5): qty 500

## 2015-10-23 NOTE — Consult Note (Signed)
Pharmacy Antibiotic Note  Rosalene Billingsauline B Rogerson is a 82 y.o. female admitted on 10/23/2015 with pneumonia.  Pharmacy has been consulted for cefepime and vancomycin dosing.  Plan: Pt received 1g of vancomycin in the ED. Will start vancomycin 500mg  17 hours after intial dose for stack dosing Vancomycin 500 IV every 24 hours.  Goal trough 15-20 mcg/mL.  Cefepime 1g q 24 hours due to renal function Will draw vanc trough prior to the 5th dose 4/26 @ 1200  Height: 5\' 1"  (154.9 cm) Weight: 161109 lb (49.442 kg) IBW/kg (Calculated) : 47.8  Temp (24hrs), Avg:100.6 F (38.1 C), Min:99.7 F (37.6 C), Max:101.5 F (38.6 C)   Recent Labs Lab 10/23/15 1742  WBC 11.2*  CREATININE 0.71  LATICACIDVEN 2.1*    Estimated Creatinine Clearance: 31.7 mL/min (by C-G formula based on Cr of 0.71).    Allergies  Allergen Reactions  . Clarithromycin Diarrhea  . Aspirin Other (See Comments)    Other reaction(s): Blood Disorder-thin blood too much when patient takes too much or high doses of aspirin.  . Tetanus Toxoid Rash    Antimicrobials this admission: cefepime 4/22 >>  vancomycin 4/22 >>   Dose adjustments this admission:   Microbiology results: 4/22 BCx: pending 4/22 UCx: pending    Thank you for allowing pharmacy to be a part of this patient's care.  Olene FlossMelissa D Shields Pautz, Pharm.D Clinical Pharmacist 10/23/2015 8:01 PM

## 2015-10-23 NOTE — ED Notes (Signed)
Pt assisted up to bed side commode. Noted to be more taxed with respirations and HR up to 147 per monitor. Pt assisted back to bed and depends placded. Admitting md informed.

## 2015-10-23 NOTE — ED Notes (Signed)
Pt transported to room 110 

## 2015-10-23 NOTE — H&P (Addendum)
[facility] - [place] at [facility]   PATIENT NAME: Zoe Hughes    MR#:  [id number]  DATE OF BIRTH:  [birth year]  DATE OF ADMISSION:  10/23/2015  PRIMARY CARE PHYSICIAN: Mick Sell, MD   REQUESTING/REFERRING PHYSICIAN: Phineas Semen MD  CHIEF COMPLAINT:  SOB and cough  HISTORY OF PRESENT ILLNESS: Zoe Hughes  is a 82 y.o. female with a known history of chronic atrial fibrillation, hypertension, COPD, hypothyroidism and recent hospitalization last month for pneumonia. Who presents to the ED complaining of shortness of breath cough and wheezing. It's been going on since last night. Patient here noted to have a temperature 101. 0.5 her chest x-ray again showed evidence of pneumonia. She has not had any chest pains or palpitations. She's noticed fever at home. As well as chills. According to the patient and family she is not having any difficulties with swallowing.    PAST MEDICAL HISTORY:   Past Medical History  Diagnosis Date  . A-fib (HCC)   . Hypothyroid   . Hypertension     PAST SURGICAL HISTORY: Past Surgical History  Procedure Laterality Date  . Cholecystectomy    . Abdominal hysterectomy      SOCIAL HISTORY:  Social History  Substance Use Topics  . Smoking status: Never Smoker   . Smokeless tobacco: Never Used  . Alcohol Use: No    FAMILY HISTORY: History reviewed. No pertinent family history.  DRUG ALLERGIES:  Allergies  Allergen Reactions  . Clarithromycin Diarrhea  . Aspirin Other (See Comments)    Other reaction(s): Blood Disorder-thin blood too much when patient takes too much or high doses of aspirin.  . Tetanus Toxoid Rash    REVIEW OF SYSTEMS:   CONSTITUTIONAL: Positivever,  positive fatigue positive weakness  EYES: No blurred or double vision.  EARS, NOSE, AND THROAT: No tinnitus or ear pain.  RESPIRATORY: positiveugh,  positive shortness of breath,  positive wheezing or hemoptysis.  CARDIOVASCULAR: No chest  pain, orthopnea, edema.  GASTROINTESTINAL: No nausea, vomiting, diarrhea or abdominal pain.  GENITOURINARY: No dysuria, hematuria.  ENDOCRINE: No polyuria, nocturia,  HEMATOLOGY: No anemia, easy bruising or bleeding SKIN: No rash or lesion. MUSCULOSKELETAL: No joint pain or arthritis.   NEUROLOGIC: No tingling, numbness, weakness.  PSYCHIATRY: No anxiety or depression.   MEDICATIONS AT HOME:    Medication List    ASK your doctor about these medications        acetaminophen 500 MG tablet  Commonly known as:  TYLENOL  Take 1 tablet (500 mg total) by mouth every 6 (six) hours as needed for mild pain.     albuterol (2.5 MG/3ML) 0.083% nebulizer solution  Commonly known as:  PROVENTIL  Take 3 mLs (2.5 mg total) by nebulization every 4 (four) hours as needed for wheezing or shortness of breath.     ALPRAZolam 0.25 MG tablet  Commonly known as:  XANAX  Take 0.25 mg by mouth 2 (two) times daily as needed for anxiety.     aspirin EC 81 MG tablet  Take 81 mg by mouth every other day.     diltiazem 300 MG 24 hr capsule  Commonly known as:  CARDIZEM CD  Take 300 mg by mouth daily.     hydrocortisone 2.5 % rectal cream  Commonly known as:  ANUSOL-HC  Place rectally 3 (three) times daily.     latanoprost 0.005 % ophthalmic solution  Commonly known as:  XALATAN  Place 1 drop into the right eye  at bedtime.     levofloxacin 250 MG tablet  Commonly known as:  LEVAQUIN  Take 1 tablet (250 mg total) by mouth daily.     levothyroxine 88 MCG tablet  Commonly known as:  SYNTHROID, LEVOTHROID  Take 88 mcg by mouth daily before breakfast.     loratadine 10 MG tablet  Commonly known as:  CLARITIN  Take 10 mg by mouth daily as needed. For allergies.          PHYSICAL EXAMINATION:   VITAL SIGNS: Blood pressure 151/69, pulse 107, temperature 101.5 F (38.6 C), temperature source Rectal, resp. rate 30, height  (1.549 m), weight 49.442 kg (109 lb), SpO2 96 %.  GENERAL:  82  y.o.-year-old patient lying in the bed with no acute distress.  EYES: Pupils equal, round, reactive to light and accommodation. No scleral icterus. Extraocular muscles intact.  HEENT: Head atraumatic, normocephalic. Oropharynx and nasopharynx clear.  NECK:  Supple, no jugular venous distention. No thyroid enlargement, no tenderness.  LUNGS: has wheezing bilaterally with local occasional rhonchi bilaterally  CARDIOVASCULAR:  irregularly irregular rhythm l. No murmurs, rubs, or gallops.  ABDOMEN: Soft, nontender, nondistended. Bowel sounds present. No organomegaly or mass.  EXTREMITIES: No pedal edema, cyanosis, or clubbing.  has a brace on the left hand NEUROLOGIC: Cranial nerves II through XII are intact. Muscle strength 5/5 in all extremities. Sensation intact. Gait not checked.  PSYCHIATRIC: The patient is alert and oriented x 3.  SKIN: No obvious rash, lesion, or ulcer.   LABORATORY PANEL:   CBC  Recent Labs Lab 10/23/15 1742  WBC 11.2*  HGB 12.9  HCT 39.6  PLT 185  MCV 88.9  MCH 29.0  MCHC 32.6  RDW 15.1*  LYMPHSABS 0.4*  MONOABS 0.5  EOSABS 0.0  BASOSABS 0.0   ------------------------------------------------------------------------------------------------------------------  Chemistries   Recent Labs Lab 10/23/15 1742  NA 132*  K 3.8  CL 98*  CO2 23  GLUCOSE 241*  BUN 16  CREATININE 0.71  CALCIUM 8.6*   ------------------------------------------------------------------------------------------------------------------ estimated creatinine clearance is 31.7 mL/min (by C-G formula based on Cr of 0.71). ------------------------------------------------------------------------------------------------------------------ No results for input(s): TSH, T4TOTAL, T3FREE, THYROIDAB in the last 72 hours.  Invalid input(s): FREET3   Coagulation profile No results for input(s): INR, PROTIME in the last 168  hours. ------------------------------------------------------------------------------------------------------------------- No results for input(s): DDIMER in the last 72 hours. -------------------------------------------------------------------------------------------------------------------  Cardiac Enzymes  Recent Labs Lab 10/23/15 1742  TROPONINI 0.06*   ------------------------------------------------------------------------------------------------------------------ Invalid input(s): POCBNP  ---------------------------------------------------------------------------------------------------------------  Urinalysis    Component Value Date/Time   COLORURINE YELLOW* 10/23/2015 1845   COLORURINE Straw 08/25/2013 0615   APPEARANCEUR HAZY* 10/23/2015 1845   APPEARANCEUR Clear 08/25/2013 0615   LABSPEC 1.012 10/23/2015 1845   LABSPEC 1.002 08/25/2013 0615   PHURINE 5.0 10/23/2015 1845   PHURINE 7.0 08/25/2013 0615   GLUCOSEU >500* 10/23/2015 1845   GLUCOSEU Negative 08/25/2013 0615   HGBUR NEGATIVE 10/23/2015 1845   HGBUR Negative 08/25/2013 0615   BILIRUBINUR NEGATIVE 10/23/2015 1845   BILIRUBINUR Negative 08/25/2013 0615   KETONESUR NEGATIVE 10/23/2015 1845   KETONESUR Negative 08/25/2013 0615   PROTEINUR 100* 10/23/2015 1845   PROTEINUR Negative 08/25/2013 0615   NITRITE NEGATIVE 10/23/2015 1845   NITRITE Negative 08/25/2013 0615   LEUKOCYTESUR 1+* 10/23/2015 1845   LEUKOCYTESUR 1+ 08/25/2013 0615     RADIOLOGY: Dg Chest Portable 1 View  10/23/2015  CLINICAL DATA:  Shortness of breath EXAM: PORTABLE CHEST 1 VIEW COMPARISON:  08/23/2015 chest radiograph. FINDINGS: Stable cardiomediastinal silhouette  with top-normal heart size. No pneumothorax. Trace bilateral pleural effusions. Mild hazy parahilar lung opacities asymmetric to the right. IMPRESSION: Mild hazy parahilar lung opacities asymmetric to the right, favor asymmetric mild pulmonary edema, cannot exclude a  pneumonia. Trace bilateral pleural effusions. Electronically Signed   By: Delbert PhenixJason A Poff M.D.   On: 10/23/2015 18:03    EKG: Orders placed or performed during the hospital encounter of 10/23/15  . ED EKG  . ED EKG  . EKG 12-Lead  . EKG 12-Lead    IMPRESSION AND PLAN: Patient is a 82 year old presenting with shortness of breath cough and fever  1. Cough shortness of breath and fever due to recurrent pneumonia at this time she has been started on therapy for age Which I will continue with cefepime and vancomycin. Due to recurrent pneumonia I will ask speech to evaluate for dysphagia and aspiration   2. Hypothyroidism continue Synthroid as taking at home  3. Atrial fibrillation with poor heart rate control continue to monitor on telemetry we'll continue Cardizem  4. Axiety disorder continue trazodone and alprazolam  5. COPD with acute flare will treated with nebulizers and I will add low-dose steroids to her regimen  6. Miscellaneous we'll do Lovenox for DVT prophylaxis   All the records are reviewed and case discussed with ED provider. Management plans discussed with the patient, family and they are in agreement.  CODE STATUS:    Code Status Orders        Start     Ordered   10/23/15 1938  Full code   Continuous     10/23/15 1938    Code Status History    Date Active Date Inactive Code Status Order ID Comments User Context   08/22/2015  4:50 PM 08/25/2015  4:41 PM Full Code [id number]  Enedina FinnerSona Sharlyne Koeneman, MD Inpatient    Advance Directive Documentation        Most Recent Value   Type of Advance Directive  Healthcare Power of Attorney   Pre-existing out of facility DNR order (yellow form or pink MOST form)     "MOST" Form in Place?         TOTAL TIME TAKING CARE OF THIS PATIENT: 55 minutes.    Auburn BilberryPATEL, Karder Goodin M.D on 10/23/2015 at 8:20 PM  Between 7am to 6pm - Pager - [phone]  After 6pm go to [email or web address] - password [email or web address] [facility]  [facility]  Office   [phone]  CC: Primary care physician; Mick SellFITZGERALD, DAVID P, MD

## 2015-10-23 NOTE — ED Notes (Signed)
Pt arrived from home with c/o SOB. Family states this is the Pt's 3rd visit since February. She was previously diagnosed with pneumonia.

## 2015-10-23 NOTE — ED Provider Notes (Signed)
[facility] Emergency Department Provider Note    ____________________________________________  Time seen: ~1805  I have reviewed the triage vital signs and the nursing notes.   HISTORY  Chief Complaint SOB  History limited by: Not Limited   HPI Zoe Hughes is a 82 y.o. female who presents to the emergency department today because of shortness of breath. She states that the symptoms started last night. She states she was not able to sleep. She states she tried using pillows. She states that the symptoms persisted today. They did become worse. She denies any significant chest pain. She states she can hear a noise in her chest. She states that she did feel hot last night. She has been coughing up dark phlegm. She states that she has had pneumonia recently. Denies any nausea or vomiting. Denies any diarrhea.   Past Medical History  Diagnosis Date  . A-fib (HCC)   . Hypothyroid   . Hypertension     Patient Active Problem List   Diagnosis Date Noted  . Hyponatremia 08/22/2015    Past Surgical History  Procedure Laterality Date  . Cholecystectomy    . Abdominal hysterectomy      Current Outpatient Rx  Name  Route  Sig  Dispense  Refill  . acetaminophen (TYLENOL) 500 MG tablet   Oral   Take 1 tablet (500 mg total) by mouth every 6 (six) hours as needed for mild pain.   30 tablet   0   . albuterol (PROVENTIL) (2.5 MG/3ML) 0.083% nebulizer solution   Nebulization   Take 3 mLs (2.5 mg total) by nebulization every 4 (four) hours as needed for wheezing or shortness of breath.   75 mL   12   . ALPRAZolam (XANAX) 0.25 MG tablet   Oral   Take 0.25 mg by mouth 2 (two) times daily as needed for anxiety.         Marland Kitchen aspirin EC 81 MG tablet   Oral   Take 81 mg by mouth every other day.         . clotrimazole-betamethasone (LOTRISONE) cream   Topical   Apply 1 application topically 2 (two) times daily.         Marland Kitchen diltiazem (CARDIZEM CD) 300  MG 24 hr capsule   Oral   Take 300 mg by mouth daily.         Marland Kitchen diltiazem (DILACOR XR) 240 MG 24 hr capsule   Oral   Take 240 mg by mouth daily.         . hydrocortisone (ANUSOL-HC) 2.5 % rectal cream   Rectal   Place rectally 3 (three) times daily.   30 g   0   . latanoprost (XALATAN) 0.005 % ophthalmic solution   Right Eye   Place 1 drop into the right eye at bedtime.          Marland Kitchen levofloxacin (LEVAQUIN) 250 MG tablet   Oral   Take 1 tablet (250 mg total) by mouth daily. Patient not taking: Reported on 09/09/2015   4 tablet   0   . levothyroxine (SYNTHROID, LEVOTHROID) 50 MCG tablet   Oral   Take 50 mcg by mouth daily.         Marland Kitchen levothyroxine (SYNTHROID, LEVOTHROID) 75 MCG tablet   Oral   Take 75 mcg by mouth daily.         Marland Kitchen EXPIRED: loratadine (CLARITIN) 10 MG tablet   Oral   Take 10 mg  by mouth daily as needed. For allergies.           Allergies Clarithromycin; Aspirin; and Tetanus toxoid  History reviewed. No pertinent family history.  Social History Social History  Substance Use Topics  . Smoking status: Never Smoker   . Smokeless tobacco: Never Used  . Alcohol Use: No    Review of Systems  Constitutional: Negative for fever. Cardiovascular: Negative for chest pain. Respiratory: Negative for shortness of breath. Gastrointestinal: Negative for abdominal pain, vomiting and diarrhea. Genitourinary: Negative for dysuria. Musculoskeletal: Negative for back pain. Skin: Negative for rash. Neurological: Negative for headaches, focal weakness or numbness.   10-point ROS otherwise negative.  ____________________________________________   PHYSICAL EXAM:  VITAL SIGNS: ED Triage Vitals  Enc Vitals Group     BP 10/23/15 1738 166/90 mmHg     Pulse Rate 10/23/15 1738 123     Resp 10/23/15 1738 29     Temp 10/23/15 1738 99.7 F (37.6 C)     Temp Source 10/23/15 1738 Oral     SpO2 10/23/15 1738 94 %     Weight 10/23/15 1742 109 lb (49.442  kg)     Height 10/23/15 1742 5\' 1"  (1.549 m)   Constitutional: Alert and oriented. Mild increased respiratory effort.  Eyes: Conjunctivae are normal. PERRL. Normal extraocular movements. ENT   Head: Normocephalic and atraumatic.   Nose: No congestion/rhinnorhea.   Mouth/Throat: Mucous membranes are moist.   Neck: No stridor. Hematological/Lymphatic/Immunilogical: No cervical lymphadenopathy. Cardiovascular: Tachycardic, irregularly irregular rhythm. Respiratory: Increased respiratory effort with tachypnea. Diffuse rhonchi. Gastrointestinal: Soft and nontender. No distention.  Genitourinary: Deferred Musculoskeletal: Normal range of motion in all extremities. No joint effusions.  No lower extremity tenderness nor edema. Neurologic:  Normal speech and language. No gross focal neurologic deficits are appreciated.  Skin:  Skin is warm, dry and intact. No rash noted. Psychiatric: Mood and affect are normal. Speech and behavior are normal. Patient exhibits appropriate insight and judgment.  ____________________________________________    LABS (pertinent positives/negatives)  Labs Reviewed  TROPONIN I - Abnormal; Notable for the following:    Troponin I 0.06 (*)    All other components within normal limits  CBC WITH DIFFERENTIAL/PLATELET - Abnormal; Notable for the following:    WBC 11.2 (*)    RDW 15.1 (*)    Neutro Abs 10.3 (*)    Lymphs Abs 0.4 (*)    All other components within normal limits  BASIC METABOLIC PANEL - Abnormal; Notable for the following:    Sodium 132 (*)    Chloride 98 (*)    Glucose, Bld 241 (*)    Calcium 8.6 (*)    All other components within normal limits  LACTIC ACID, PLASMA - Abnormal; Notable for the following:    Lactic Acid, Venous 2.1 (*)    All other components within normal limits  URINALYSIS COMPLETEWITH MICROSCOPIC ([facility] ONLY) - Abnormal; Notable for the following:    Color, Urine YELLOW (*)    APPearance HAZY (*)    Glucose, UA  >500 (*)    Protein, ur 100 (*)    Leukocytes, UA 1+ (*)    Bacteria, UA RARE (*)    Squamous Epithelial / LPF 0-5 (*)    All other components within normal limits  CULTURE, BLOOD (ROUTINE X 2)  CULTURE, BLOOD (ROUTINE X 2)  URINE CULTURE  CULTURE, EXPECTORATED SPUTUM-ASSESSMENT  LACTIC ACID, PLASMA  CBC  BASIC METABOLIC PANEL     ____________________________________________   EKG  I,  Phineas Semen, attending physician, personally viewed and interpreted this EKG  EKG Time: 1737 Rate: 121 Rhythm: atrial fibrillation with RVR Axis: left axis deviation Intervals: qtc 437 QRS: narrow, q waves V1, V2 ST changes: no st elevation Impression: abnormal ekg   ____________________________________________    RADIOLOGY  CXR IMPRESSION: Mild hazy parahilar lung opacities asymmetric to the right, favor asymmetric mild pulmonary edema, cannot exclude a pneumonia.  Trace bilateral pleural effusions.  ____________________________________________   PROCEDURES  Procedure(s) performed: None  Critical Care performed: Yes, see critical care note(s)  CRITICAL CARE Performed by: Phineas Semen   Total critical care time: 30 minutes  Critical care time was exclusive of separately billable procedures and treating other patients.  Critical care was necessary to treat or prevent imminent or life-threatening deterioration.  Critical care was time spent personally by me on the following activities: development of treatment plan with patient and/or surrogate as well as nursing, discussions with consultants, evaluation of patient's response to treatment, examination of patient, obtaining history from patient or surrogate, ordering and performing treatments and interventions, ordering and review of laboratory studies, ordering and review of radiographic studies, pulse oximetry and re-evaluation of patient's condition.  ____________________________________________   INITIAL  IMPRESSION / ASSESSMENT AND PLAN / ED COURSE  Pertinent labs & imaging results that were available during my care of the patient were reviewed by me and considered in my medical decision making (see chart for details).  Patient presented to the emergency department today because of concerns for shortness of breath. Initial vital signs concerning for hypoxia, tachypnea and tachycardia. Patient was then found to be febrile on rectal check. Given these findings the patient was called as a code sepsis. Workup did reveal a chest x-ray concerning for pneumonia and mild leukocytosis. Patient additionally had elevated troponin and lactic acidosis. Patient was given IV fluid bolus and multiple broad-spectrum antibiotics. Patient will be admitted to the hospital service.  ____________________________________________   FINAL CLINICAL IMPRESSION(S) / ED DIAGNOSES  Final diagnoses:  Sepsis, due to unspecified organism [facility])  Healthcare-associated pneumonia     Phineas Semen, MD 10/23/15 2008

## 2015-10-24 ENCOUNTER — Inpatient Hospital Stay: Payer: Medicare Other

## 2015-10-24 LAB — BASIC METABOLIC PANEL
ANION GAP: 11 (ref 5–15)
BUN: 13 mg/dL (ref 6–20)
CHLORIDE: 104 mmol/L (ref 101–111)
CO2: 22 mmol/L (ref 22–32)
Calcium: 8.4 mg/dL — ABNORMAL LOW (ref 8.9–10.3)
Creatinine, Ser: 0.74 mg/dL (ref 0.44–1.00)
Glucose, Bld: 190 mg/dL — ABNORMAL HIGH (ref 65–99)
POTASSIUM: 3.4 mmol/L — AB (ref 3.5–5.1)
SODIUM: 137 mmol/L (ref 135–145)

## 2015-10-24 LAB — CBC
HCT: 40.8 % (ref 35.0–47.0)
HEMOGLOBIN: 13.7 g/dL (ref 12.0–16.0)
MCH: 29.5 pg (ref 26.0–34.0)
MCHC: 33.5 g/dL (ref 32.0–36.0)
MCV: 88.1 fL (ref 80.0–100.0)
Platelets: 176 10*3/uL (ref 150–440)
RBC: 4.64 MIL/uL (ref 3.80–5.20)
RDW: 15.2 % — ABNORMAL HIGH (ref 11.5–14.5)
WBC: 11.4 10*3/uL — AB (ref 3.6–11.0)

## 2015-10-24 MED ORDER — FUROSEMIDE 10 MG/ML IJ SOLN
20.0000 mg | Freq: Once | INTRAMUSCULAR | Status: AC
  Administered 2015-10-24: 02:00:00 20 mg via INTRAVENOUS
  Filled 2015-10-24: qty 2

## 2015-10-24 MED ORDER — DILTIAZEM HCL 100 MG IV SOLR
5.0000 mg/h | INTRAVENOUS | Status: DC
  Administered 2015-10-24 (×2): 7.5 mg/h via INTRAVENOUS
  Administered 2015-10-24 – 2015-10-25 (×3): 5 mg/h via INTRAVENOUS
  Filled 2015-10-24 (×3): qty 100

## 2015-10-24 NOTE — Progress Notes (Signed)
Patient ID: Zoe Hughes, female   DOB: January 09, 1919, 82 y.o.   MRN: [id number] [facility] [place] [facility]   PATIENT NAME: Zoe Hughes    MR#:  [id number]  DATE OF BIRTH:  January 09, 1919  SUBJECTIVE:  [facility] after she started having some shortness of breath and fever of 101.5. Found to have pneumonia. Patient reports sick residents at home place Patient has history of chronic atrial fibrillation. She had tachycardia now on Cardizem drip. REVIEW OF SYSTEMS:   Review of Systems  Constitutional: Positive for fever. Negative for chills and weight loss.  HENT: Negative for ear discharge, ear pain and nosebleeds.   Eyes: Negative for blurred vision, pain and discharge.  Respiratory: Positive for cough and shortness of breath. Negative for sputum production, wheezing and stridor.   Cardiovascular: Positive for palpitations. Negative for chest pain, orthopnea and PND.  Gastrointestinal: Negative for nausea, vomiting, abdominal pain and diarrhea.  Genitourinary: Negative for urgency and frequency.  Musculoskeletal: Negative for back pain and joint pain.  Neurological: Positive for weakness. Negative for sensory change, speech change and focal weakness.  Psychiatric/Behavioral: Negative for depression and hallucinations. The patient is not nervous/anxious.   All other systems reviewed and are negative.  Tolerating Diet:  yes Tolerating PT: Pending  DRUG ALLERGIES:   Allergies  Allergen Reactions  . Clarithromycin Diarrhea  . Aspirin Other (See Comments)    Other reaction(s): Blood Disorder-thin blood too much when patient takes too much or high doses of aspirin.  . Tetanus Toxoid Rash    VITALS:  Blood pressure 146/69, pulse 112, temperature 97.6 F (36.4 C), temperature source Oral, resp. rate 20, height 5\' 2"  (1.575 m), weight 54.522 kg (120 lb 3.2 oz), SpO2 96 %.  PHYSICAL EXAMINATION:   Physical Exam  GENERAL:   82 y.o.-year-old patient lying in the bed with no acute distress. 10 EYES: Pupils equal, round, reactive to light and accommodation. No scleral icterus. Extraocular muscles intact.  HEENT: Head atraumatic, normocephalic. Oropharynx and nasopharynx clear.  NECK:  Supple, no jugular venous distention. No thyroid enlargement, no tenderness.  LUNGS: Coarse breath sounds bilaterally, no wheezing, rales, rhonchi. No use of accessory muscles of respiration.  CARDIOVASCULAR: S1, S2 normal. No murmurs, rubs, or gallops. Irregularly irregular ABDOMEN: Soft, nontender, nondistended. Bowel sounds present. No organomegaly or mass.  EXTREMITIES: No cyanosis, clubbing or edema b/l.    NEUROLOGIC: Cranial nerves II through XII are intact. No focal Motor or sensory deficits b/l.  Subjectively weak PSYCHIATRIC:  patient is alert and oriented x 3.  SKIN: No obvious rash, lesion, or ulcer.   LABORATORY PANEL:  CBC  Recent Labs Lab 10/24/15 0455  WBC 11.4*  HGB 13.7  HCT 40.8  PLT 176    Chemistries   Recent Labs Lab 10/24/15 0455  NA 137  K 3.4*  CL 104  CO2 22  GLUCOSE 190*  BUN 13  CREATININE 0.74  CALCIUM 8.4*   Cardiac Enzymes  Recent Labs Lab 10/23/15 1742  TROPONINI 0.06*   RADIOLOGY:  Dg Chest Port 1 View  10/24/2015  CLINICAL DATA:  Increased shortness of breath, cough, and wheezing this morning. EXAM: PORTABLE CHEST 1 VIEW COMPARISON:  10/23/2015 FINDINGS: Cardiac enlargement. Increasing perihilar airspace disease in the lungs likely represent edema but could also represent multifocal pneumonia. Probable small bilateral pleural effusions. No pneumothorax. Calcification of the aorta. IMPRESSION: Increasing perihilar airspace disease in the lungs associated with cardiac enlargement and  small pleural effusions. This likely represents progressive edema although multifocal pneumonia could also have this appearance. Electronically Signed   By: Burman Nieves M.D.   On: 10/24/2015  00:33   Dg Chest Portable 1 View  10/23/2015  CLINICAL DATA:  Shortness of breath EXAM: PORTABLE CHEST 1 VIEW COMPARISON:  08/23/2015 chest radiograph. FINDINGS: Stable cardiomediastinal silhouette with top-normal heart size. No pneumothorax. Trace bilateral pleural effusions. Mild hazy parahilar lung opacities asymmetric to the right. IMPRESSION: Mild hazy parahilar lung opacities asymmetric to the right, favor asymmetric mild pulmonary edema, cannot exclude a pneumonia. Trace bilateral pleural effusions. Electronically Signed   By: Delbert Phenix M.D.   On: 10/23/2015 18:03   ASSESSMENT AND PLAN:  Zoe Hughes is a 82 y.o. female with a known history of chronic atrial fibrillation, hypertension, COPD, hypothyroidism and recent hospitalization in February for pneumonia. Who presents to the ED complaining of shortness of breath cough and wheezing. It's been going on since last night. Patient here noted to have a temperature 101.  1. sepsis due to  due to recurrent pneumonia  -Continue cefepime and vancomycin. Due to recurrent pneumonia I will ask speech to evaluate for dysphagia and aspiration  -Monitor white count -Nebs, incentive spirometer -Wean oxygen as tolerated  2. Hypothyroidism continue Synthroid as taking at home  3. Acute on chronic Atrial fibrillation with poor heart rate control in the setting of sepsis -Wean IV Cardizem to off and in the meantime start patient on by mouth Cardizem her home dose -Patient not on any chronic anticoagulation  4. Axiety disorder continue trazodone and alprazolam  5. COPD with acute flare will treated with nebulizers and I will add low-dose steroids to her regimen  6. Miscellaneous we'll do Lovenox for DVT prophylaxis Physical therapy evaluated patient Case discussed with Care Management/Social Worker. Management plans discussed with the patient, family and they are in agreement.  CODE STATUS: Full   DVT Prophylaxis: *Lovenox   TOTAL TIME  TAKING CARE OF THIS PATIENT: 35 minutes.  >50% time spent on counselling and coordination of care  POSSIBLE D/C IN one to 2 DAYS, DEPENDING ON CLINICAL CONDITION.  Note: This dictation was prepared with Dragon dictation along with smaller phrase technology. Any transcriptional errors that result from this process are unintentional.  Shaden Lacher M.D on 10/24/2015 at 11:55 AM  Between 7am to 6pm - Pager - [phone]  After 6pm go to [email or web address] - password [email or web address] [facility]  [facility]  Office  [phone]  CC: Primary care physician; Mick Sell, MD

## 2015-10-24 NOTE — Care Management Note (Addendum)
Case Management Note  Patient Details  Name: Zoe Hughes MRN: [id number] Date of Birth: 1918-07-05  Subjective/Objective:     82yo Zoe Hughes was admitted 10/23/15 from her residence at [facility] with recurrent PNA. Daughter Ferd GlassingBetty Euliss provides transportation to appointments. PCP=Dr Sampson GoonFitzgerald. Pharmacy=[facility]. Home equipment includes a rolling walker and a toilet seat riser. No home oxygen. Anticipate discharge to home with home health. Case management will follow for discharge planning.                Action/Plan:   Expected Discharge Date:                  Expected Discharge Plan:     In-House Referral:     Discharge planning Services     Post Acute Care Choice:    Choice offered to:     DME Arranged:    DME Agency:     HH Arranged:    HH Agency:     Status of Service:     Medicare Important Message Given:  Yes Date Medicare IM Given:    Medicare IM give by:    Date Additional Medicare IM Given:    Additional Medicare Important Message give by:     If discussed at Long Length of Stay Meetings, dates discussed:    Additional Comments:  Rorey Hodges A, RN 10/24/2015, 4:11 PM

## 2015-10-24 NOTE — Care Management Important Message (Signed)
Important Message  Patient Details  Name: Zoe Hughes MRN: [id number] Date of Birth: [birth year]   Medicare Important Message Given:  Yes    Manas Hickling A, RN 10/24/2015, 2:14 PM

## 2015-10-25 ENCOUNTER — Inpatient Hospital Stay: Payer: Medicare Other

## 2015-10-25 LAB — MAGNESIUM: Magnesium: 2.1 mg/dL (ref 1.7–2.4)

## 2015-10-25 LAB — POTASSIUM: POTASSIUM: 4.1 mmol/L (ref 3.5–5.1)

## 2015-10-25 MED ORDER — METHYLPREDNISOLONE SODIUM SUCC 40 MG IJ SOLR
40.0000 mg | Freq: Every day | INTRAMUSCULAR | Status: DC
  Administered 2015-10-26: 40 mg via INTRAVENOUS
  Filled 2015-10-25: qty 1

## 2015-10-25 MED ORDER — POTASSIUM CHLORIDE 20 MEQ/15ML (10%) PO SOLN: 40.0000 meq | Freq: Once | ORAL | Status: DC

## 2015-10-25 MED ORDER — DEXTROSE 5 % IV SOLN
2.0000 g | INTRAVENOUS | Status: DC
  Administered 2015-10-25 – 2015-10-26 (×2): 2 g via INTRAVENOUS
  Filled 2015-10-25 (×3): qty 2

## 2015-10-25 MED ORDER — POLYETHYLENE GLYCOL 3350 17 G PO PACK
17.0000 g | PACK | Freq: Every day | ORAL | Status: DC
  Administered 2015-10-25 – 2015-10-27 (×3): 17 g via ORAL
  Filled 2015-10-25 (×3): qty 1

## 2015-10-25 MED ORDER — POTASSIUM CHLORIDE CRYS ER 20 MEQ PO TBCR: 20.0000 meq | EXTENDED_RELEASE_TABLET | Freq: Once | ORAL | Status: DC

## 2015-10-25 NOTE — Progress Notes (Signed)
Electrolyte Supplementation CONSULT NOTE - INITIAL   Pharmacy Consult for Potassium Supplementation Indication: Hypokalemia  Allergies  Allergen Reactions  . Clarithromycin Diarrhea  . Aspirin Other (See Comments)    Other reaction(s): Blood Disorder-thin blood too much when patient takes too much or high doses of aspirin.  . Tetanus Toxoid Rash    Patient Measurements: Height: 5\' 2"  (157.5 cm) Weight: 120 lb 3.2 oz (54.522 kg) IBW/kg (Calculated) : 50.1   Vital Signs: BP: 149/87 mmHg (04/24 0745) Pulse Rate: 85 (04/24 0745) Intake/Output from previous day: 04/23 0701 - 04/24 0700 In: 240 [P.O.:240] Out: 1100 [Urine:1100] Intake/Output from this shift: Total I/O In: 120 [P.O.:120] Out: 200 [Urine:200]  Labs:  Recent Labs  10/23/15 1742 10/24/15 0455 10/25/15 1446  WBC 11.2* 11.4*  --   HGB 12.9 13.7  --   HCT 39.6 40.8  --   PLT 185 176  --   CREATININE 0.71 0.74  --   MG  --   --  2.1   Estimated Creatinine Clearance: 33.3 mL/min (by C-G formula based on Cr of 0.74).   Microbiology: Recent Results (from the past 720 hour(s))  Blood culture (routine x 2)     Status: None (Preliminary result)   Collection Time: 10/23/15  5:42 PM  Result Value Ref Range Status   Specimen Description BLOOD RIGHT ANTECUBITAL  Final   Special Requests BOTTLES DRAWN AEROBIC AND ANAEROBIC  4CC  Final   Culture NO GROWTH < 24 HOURS  Final   Report Status PENDING  Incomplete  Blood culture (routine x 2)     Status: None (Preliminary result)   Collection Time: 10/23/15  5:42 PM  Result Value Ref Range Status   Specimen Description BLOOD LEFT ARM  Final   Special Requests BOTTLES DRAWN AEROBIC AND ANAEROBIC  6CC  Final   Culture NO GROWTH < 24 HOURS  Final   Report Status PENDING  Incomplete  Urine culture     Status: None (Preliminary result)   Collection Time: 10/23/15  6:45 PM  Result Value Ref Range Status   Specimen Description URINE, RANDOM  Final   Special Requests NONE   Final   Culture HOLDING FOR POSSIBLE PATHOGEN  Final   Report Status PENDING  Incomplete    Medical History: Past Medical History  Diagnosis Date  . A-fib (HCC)   . Hypothyroid   . Hypertension     Medications:  Scheduled:  . aspirin EC  81 mg Oral QODAY  . ceFEPime (MAXIPIME) IV  2 g Intravenous Q24H  . diltiazem  300 mg Oral Daily  . enoxaparin (LOVENOX) injection  40 mg Subcutaneous Q24H  . ipratropium-albuterol  3 mL Nebulization Q6H  . latanoprost  1 drop Right Eye QHS  . levothyroxine  88 mcg Oral QAC breakfast  . loratadine  10 mg Oral Daily  . [START ON 10/26/2015] methylPREDNISolone (SOLU-MEDROL) injection  40 mg Intravenous Daily  . pneumococcal 23 valent vaccine  0.5 mL Intramuscular Tomorrow-1000  . sodium chloride flush  3 mL Intravenous Q12H  . sodium chloride flush  3 mL Intravenous Q12H  . vancomycin  500 mg Intravenous Q24H    Assessment: Pharmacy consulted to supplement potassium in a 82 yo female admitted with pneumonia.    K: 3.4 (4/23), Mag pending  Goal of Therapy:  K: 3.5-5.1 Mag: 1.7-2.4  Plan:  Will recheck potassium level for today.  Last potassium of 3.4 on 4/23.  Magnesium level is pending.  BMP ordered  in AM.  4/24 @ 14:30 :  K = 4.1 ,  Mag = 2.1 .   No further supplementation needed.  Will check electrolytes on 4/25 with AM labs.   Pharmacy will continue to follow.   Sutter Ahlgren D 10/25/2015,6:15 PM

## 2015-10-25 NOTE — Consult Note (Signed)
Pharmacy Antibiotic Note  Rosalene Billingsauline B Dolloff is a 82 y.o. female admitted on 10/23/2015 with pneumonia.  Pharmacy has been consulted for cefepime and vancomycin dosing.  Plan:  Vancomycin 500 IV every 24 hours.  Goal trough 15-20 mcg/mL.  Cefepime 2g q 24 hours due to renal function Will draw vanc trough prior to the 5th dose 4/26 @ 1200  Height: 5\' 2"  (157.5 cm) Weight: 120 lb 3.2 oz (54.522 kg) IBW/kg (Calculated) : 50.1  Temp (24hrs), Avg:97.6 F (36.4 C), Min:97.5 F (36.4 C), Max:97.6 F (36.4 C)   Recent Labs Lab 10/23/15 1742 10/23/15 2131 10/24/15 0455  WBC 11.2*  --  11.4*  CREATININE 0.71  --  0.74  LATICACIDVEN 2.1* 1.4  --     Estimated Creatinine Clearance: 33.3 mL/min (by C-G formula based on Cr of 0.74).    Allergies  Allergen Reactions  . Clarithromycin Diarrhea  . Aspirin Other (See Comments)    Other reaction(s): Blood Disorder-thin blood too much when patient takes too much or high doses of aspirin.  . Tetanus Toxoid Rash    Antimicrobials this admission: cefepime 4/22 >>  vancomycin 4/22 >>   Dose adjustments this admission:   Microbiology results: 4/22 BCx: NGTD x 2 4/22 UCx: pending    Thank you for allowing pharmacy to be a part of this patient's care.  Clarisa Schoolsrystal Dysen Edmondson, PharmD Clinical Pharmacist 10/25/2015

## 2015-10-25 NOTE — Progress Notes (Signed)
Patient ID: MARYLAND STELL, female   DOB: 1918-12-04, 82 y.o.   MRN: [id number] [facility] [place] at [facility]   PATIENT NAME: Latia Mataya    MR#:  [id number]  DATE OF BIRTH:  1918-08-09  SUBJECTIVE:  Patient doing much better this am slept well denies chest pain, SOB palpitations. Breathing better.   REVIEW OF SYSTEMS:   Review of Systems  Constitutional: Negative for fever, chills and weight loss.  HENT: Negative for ear discharge, ear pain and nosebleeds.   Eyes: Negative for blurred vision, pain and discharge.  Respiratory: Positive for cough. Negative for sputum production, shortness of breath, wheezing and stridor.   Cardiovascular: Negative for chest pain, palpitations, orthopnea and PND.  Gastrointestinal: Negative for nausea, vomiting, abdominal pain and diarrhea.  Genitourinary: Negative for urgency and frequency.  Musculoskeletal: Negative for back pain and joint pain.  Neurological: Negative for sensory change, speech change, focal weakness and weakness.  Psychiatric/Behavioral: Negative for depression and hallucinations. The patient is not nervous/anxious.   All other systems reviewed and are negative.  Tolerating Diet:  yes Tolerating PT:   DRUG ALLERGIES:   Allergies  Allergen Reactions  . Clarithromycin Diarrhea  . Aspirin Other (See Comments)    Other reaction(s): Blood Disorder-thin blood too much when patient takes too much or high doses of aspirin.  . Tetanus Toxoid Rash    VITALS:  Blood pressure 149/87, pulse 85, temperature 97.6 F (36.4 C), temperature source Oral, resp. rate 18, height  (1.575 m), weight 54.522 kg (120 lb 3.2 oz), SpO2 96 %.  PHYSICAL EXAMINATION:   Physical Exam  Constitutional: She is oriented to person, place, and time and well-developed, well-nourished, and in no distress. No distress.  HENT:  Head: Normocephalic.  Eyes: No scleral icterus.  Neck: Normal range of motion. Neck supple. No JVD  present. No tracheal deviation present.  Cardiovascular: Normal rate, regular rhythm and normal heart sounds.  Exam reveals no gallop and no friction rub.   No murmur heard. Pulmonary/Chest: Effort normal. No respiratory distress. She has no wheezes. She has no rales. She exhibits no tenderness.  Coarse b/l rhonchii  Abdominal: Soft. Bowel sounds are normal. She exhibits no distension and no mass. There is no tenderness. There is no rebound and no guarding.  Musculoskeletal: Normal range of motion. She exhibits no edema.  Neurological: She is alert and oriented to person, place, and time.  Skin: Skin is warm. No rash noted. No erythema.  Psychiatric: Affect and judgment normal.      LABORATORY PANEL:  CBC  Recent Labs Lab 10/24/15 0455  WBC 11.4*  HGB 13.7  HCT 40.8  PLT 176    Chemistries   Recent Labs Lab 10/24/15 0455  NA 137  K 3.4*  CL 104  CO2 22  GLUCOSE 190*  BUN 13  CREATININE 0.74  CALCIUM 8.4*   Cardiac Enzymes  Recent Labs Lab 10/23/15 1742  TROPONINI 0.06*   RADIOLOGY:  Dg Chest Port 1 View  10/24/2015  CLINICAL DATA:  Increased shortness of breath, cough, and wheezing this morning. EXAM: PORTABLE CHEST 1 VIEW COMPARISON:  10/23/2015 FINDINGS: Cardiac enlargement. Increasing perihilar airspace disease in the lungs likely represent edema but could also represent multifocal pneumonia. Probable small bilateral pleural effusions. No pneumothorax. Calcification of the aorta. IMPRESSION: Increasing perihilar airspace disease in the lungs associated with cardiac enlargement and small pleural effusions. This likely represents progressive edema although multifocal pneumonia could also have this appearance. Electronically  Signed   By: Burman NievesWilliam  Stevens M.D.   On: 10/24/2015 00:33   Dg Chest Portable 1 View  10/23/2015  CLINICAL DATA:  Shortness of breath EXAM: PORTABLE CHEST 1 VIEW COMPARISON:  08/23/2015 chest radiograph. FINDINGS: Stable cardiomediastinal  silhouette with top-normal heart size. No pneumothorax. Trace bilateral pleural effusions. Mild hazy parahilar lung opacities asymmetric to the right. IMPRESSION: Mild hazy parahilar lung opacities asymmetric to the right, favor asymmetric mild pulmonary edema, cannot exclude a pneumonia. Trace bilateral pleural effusions. Electronically Signed   By: Delbert PhenixJason A Poff M.D.   On: 10/23/2015 18:03   ASSESSMENT AND PLAN:  Ladean Rayaauline Klaus is a 82 y.o. female with a known history of chronic atrial fibrillation, hypertension, COPD, hypothyroidism and recent hospitalization in February for pneumonia. Who presents to the ED complaining of shortness of breath cough and wheezing. It's been going on since last night. Patient here noted to have a temperature 101.  1. sepsis due to  due to recurrent multifocal pneumonia  -Continue cefepime and vancomycin. -Wean oxygen as tolerated - repeat CXR this am - Speech consult pending   2. Hypothyroidism continue Synthroid  3. Acute on chronic Atrial fibrillation with poor heart rate control in the setting of sepsis -Stop IV Cardizem gtt and continue PO Cardizem -Patient not on any chronic anticoagulation  4. Anxiety disorder continue trazodone and alprazolam  5. COPD with acute exacerbation: Continue steroids with plans to wean as tolerated and NEB treatments    Management plans discussed with the patient and she is in agreement.  CODE STATUS: Full   DVT Prophylaxis: Lovenox   TOTAL TIME TAKING CARE OF THIS PATIENT: 28 minutes.  >50% time spent on counselling and coordination of care  POSSIBLE D/C IN 1-2 days, DEPENDING ON CLINICAL CONDITION.  Note: This dictation was prepared with Dragon dictation along with smaller phrase technology. Any transcriptional errors that result from this process are unintentional.  Lequita Meadowcroft M.D on 10/25/2015 at 9:22 AM  Between 7am to 6pm - Pager - [phone]  After 6pm go to [email or web address] - password [email or web address]  Office  [phone]  CC: Primary care physician; Mick SellFITZGERALD, DAVID P, MD

## 2015-10-25 NOTE — Clinical Documentation Improvement (Signed)
Internal Medicine  Abnormal Lab/Test Results:   Component     Latest Ref Rng 10/24/2015  Potassium     3.5 - 5.1 mmol/L 3.4 (L)    Possible Clinical Conditions associated with below indicators  Hypokalemia  Other Condition  Cannot Clinically Determine   Supporting Information: Treatment Provided: Potassium Chloride 20 mEq po once on 10/25/15   Please exercise your independent, professional judgment when responding. A specific answer is not anticipated or expected. Please update your documentation within the medical record to reflect your response to this query. Thank you  Thank Barrie DunkerYou,  Adylin Hankey C Eyanna Mcgonagle Health Information Management [place] [phone]

## 2015-10-25 NOTE — Progress Notes (Addendum)
Electrolyte Supplementation CONSULT NOTE - INITIAL   Pharmacy Consult for Potassium Supplementation Indication: Hypokalemia  Allergies  Allergen Reactions  . Clarithromycin Diarrhea  . Aspirin Other (See Comments)    Other reaction(s): Blood Disorder-thin blood too much when patient takes too much or high doses of aspirin.  . Tetanus Toxoid Rash    Patient Measurements: Height:  (157.5 cm) Weight: 120 lb 3.2 oz (54.522 kg) IBW/kg (Calculated) : 50.1   Vital Signs: Temp: 97.6 F (36.4 C) (04/24 0514) Temp Source: Oral (04/24 0514) BP: 149/87 mmHg (04/24 0745) Pulse Rate: 85 (04/24 0745) Intake/Output from previous day: 04/23 0701 - 04/24 0700 In: 240 [P.O.:240] Out: 1100 [Urine:1100] Intake/Output from this shift: Total I/O In: 120 [P.O.:120] Out: 200 [Urine:200]  Labs:  Recent Labs  10/23/15 1742 10/24/15 0455  WBC 11.2* 11.4*  HGB 12.9 13.7  HCT 39.6 40.8  PLT 185 176  CREATININE 0.71 0.74   Estimated Creatinine Clearance: 33.3 mL/min (by C-G formula based on Cr of 0.74).   Microbiology: Recent Results (from the past 720 hour(s))  Blood culture (routine x 2)     Status: None (Preliminary result)   Collection Time: 10/23/15  5:42 PM  Result Value Ref Range Status   Specimen Description BLOOD RIGHT ANTECUBITAL  Final   Special Requests BOTTLES DRAWN AEROBIC AND ANAEROBIC  4CC  Final   Culture NO GROWTH < 24 HOURS  Final   Report Status PENDING  Incomplete  Blood culture (routine x 2)     Status: None (Preliminary result)   Collection Time: 10/23/15  5:42 PM  Result Value Ref Range Status   Specimen Description BLOOD LEFT ARM  Final   Special Requests BOTTLES DRAWN AEROBIC AND ANAEROBIC  6CC  Final   Culture NO GROWTH < 24 HOURS  Final   Report Status PENDING  Incomplete  Urine culture     Status: None (Preliminary result)   Collection Time: 10/23/15  6:45 PM  Result Value Ref Range Status   Specimen Description URINE, RANDOM  Final   Special  Requests NONE  Final   Culture HOLDING FOR POSSIBLE PATHOGEN  Final   Report Status PENDING  Incomplete    Medical History: Past Medical History  Diagnosis Date  . A-fib (HCC)   . Hypothyroid   . Hypertension     Medications:  Scheduled:  . aspirin EC  81 mg Oral QODAY  . ceFEPime (MAXIPIME) IV  2 g Intravenous Q24H  . diltiazem  300 mg Oral Daily  . enoxaparin (LOVENOX) injection  40 mg Subcutaneous Q24H  . ipratropium-albuterol  3 mL Nebulization Q6H  . latanoprost  1 drop Right Eye QHS  . levothyroxine  88 mcg Oral QAC breakfast  . loratadine  10 mg Oral Daily  . [START ON 10/26/2015] methylPREDNISolone (SOLU-MEDROL) injection  40 mg Intravenous Daily  . pneumococcal 23 valent vaccine  0.5 mL Intramuscular Tomorrow-1000  . potassium chloride  20 mEq Oral Once  . sodium chloride flush  3 mL Intravenous Q12H  . sodium chloride flush  3 mL Intravenous Q12H  . vancomycin  500 mg Intravenous Q24H    Assessment: Pharmacy consulted to supplement potassium in a 82 yo female admitted with pneumonia.    K: 3.4 (4/23), Mag pending  Goal of Therapy:  K: 3.5-5.1 Mag: 1.7-2.4  Plan:  Will recheck potassium level for today.  Last potassium of 3.4 on 4/23.  Magnesium level is pending.  BMP ordered in AM.  Pharmacy will  continue to follow.   Delecia Vastine G 10/25/2015,3:16 PM

## 2015-10-25 NOTE — Evaluation (Addendum)
Clinical/Bedside Swallow Evaluation Patient Details  Name: Zoe Hughes MRN: [id number] Date of Birth: 1918/08/16  Today's Date: 10/25/2015 Time: SLP Start Time (ACUTE ONLY): 1045 SLP Stop Time (ACUTE ONLY): 1145 SLP Time Calculation (min) (ACUTE ONLY): 60 min  Past Medical History:  Past Medical History  Diagnosis Date  . A-fib (HCC)   . Hypothyroid   . Hypertension    Past Surgical History:  Past Surgical History  Procedure Laterality Date  . Cholecystectomy    . Abdominal hysterectomy     HPI:  Pt is a 82 y.o. female with a known history of chronic atrial fibrillation, hypertension, COPD, hypothyroidism and recent hospitalization last month for pneumonia. Who presents to the ED complaining of shortness of breath cough and wheezing. It's been going on since last night. Patient here noted to have a temperature 101. 0.5 her chest x-ray again showed evidence of pneumonia. She has not had any chest pains or palpitations. She's noticed fever at home. As well as chills. According to the patient and family she is not having any difficulties with swallowing. Noted CXR presentation(airspace disease associated w/ cardiac enlargement and small pleural effusions) improved since admission; pt stated her breathing had improved "greatly" since admission. Pt verbally conversive and A/O x3.    Assessment / Plan / Recommendation Clinical Impression  Pt appeared to adequately tolerate po trials given during this eval w/ no overt s/s of aspiration noted; no decline in respiratory status during/post po trials. Vocal quality was clear post trials; no oral phase deficits were noted. Pt fed self stating she "could not accept much because I am waiting for my lunch meal". Pt stated she felt "fine" w/ her swallowing and denied any concerns. Pt does appear at reduced risk for aspiration now that her respiratroy status and breathing effort has improved overall as SOB/WOB can increase risk for aspiration during  swallowing. Rec. continue w/ current diet as pt requests w/ general aspiration precautions; tray setup as needed. Rec. Meds in Puree (whole) if needed for easier swallowing. Pt appears at her baseline w/ regard to swallowing per assessment and per her report, and is at reduced risk for aspiration at this time. ST services will sign off w/ NSG to reconsult if needed.    Aspiration Risk   (reduced)    Diet Recommendation  Regular; thin liquids; general aspiration precautions  Medication Administration: Whole meds with liquid (or whole in puree)    Other  Recommendations Recommended Consults:  (Dietician consult as needed) Oral Care Recommendations: Oral care BID;Patient independent with oral care   Follow up Recommendations  None    Frequency and Duration            Prognosis Prognosis for Safe Diet Advancement: Good      Swallow Study   General Date of Onset: 10/23/15 HPI: Pt is a 82 y.o. female with a known history of chronic atrial fibrillation, hypertension, COPD, hypothyroidism and recent hospitalization last month for pneumonia. Who presents to the ED complaining of shortness of breath cough and wheezing. It's been going on since last night. Patient here noted to have a temperature 101. 0.5 her chest x-ray again showed evidence of pneumonia. She has not had any chest pains or palpitations. She's noticed fever at home. As well as chills. According to the patient and family she is not having any difficulties with swallowing. Noted CXR presentation(airspace disease associated w/ cardiac enlargement and small pleural effusions) improved since admission; pt stated her breathing had improved "greatly"  since admission. Pt verbally conversive and A/O x3.  Type of Study: Bedside Swallow Evaluation Previous Swallow Assessment: none Diet Prior to this Study: Regular;Thin liquids Temperature Spikes Noted: No (wbc elevated) Respiratory Status: Nasal cannula (2-4 liters) History of Recent  Intubation: No Behavior/Cognition: Alert;Cooperative;Pleasant mood Oral Cavity Assessment: Within Functional Limits Oral Care Completed by SLP: Recent completion by staff Oral Cavity - Dentition: Adequate natural dentition Vision: Functional for self-feeding Self-Feeding Abilities: Able to feed self Patient Positioning: Upright in chair Baseline Vocal Quality: Normal Volitional Cough: Strong Volitional Swallow: Able to elicit    Oral/Motor/Sensory Function Overall Oral Motor/Sensory Function: Within functional limits   Ice Chips Ice chips: Not tested   Thin Liquid Thin Liquid: Within functional limits Presentation: Self Fed;Cup;Straw (2 trials by each) Other Comments: declined further stating she was waiting for her lunch meal    Nectar Thick Nectar Thick Liquid: Not tested   Honey Thick Honey Thick Liquid: Not tested   Puree Puree: Within functional limits Presentation: Self Fed;Spoon (1 trial)   Solid   GO   Solid: Within functional limits Presentation: Self Fed;Spoon (1 trial)       Jerilynn SomKatherine Jessice Madill, MS, CCC-SLP  Purnell Daigle 10/25/2015,1:40 PM

## 2015-10-26 LAB — BASIC METABOLIC PANEL
Anion gap: 9 (ref 5–15)
BUN: 30 mg/dL — AB (ref 6–20)
CHLORIDE: 101 mmol/L (ref 101–111)
CO2: 28 mmol/L (ref 22–32)
CREATININE: 0.74 mg/dL (ref 0.44–1.00)
Calcium: 9.2 mg/dL (ref 8.9–10.3)
GFR calc Af Amer: >60 mL/min (ref >60)
GFR calc non Af Amer: >60 mL/min (ref >60)
GLUCOSE: 148 mg/dL — AB (ref 65–99)
POTASSIUM: 4.1 mmol/L (ref 3.5–5.1)
SODIUM: 138 mmol/L (ref 135–145)

## 2015-10-26 LAB — URINE CULTURE

## 2015-10-26 MED ORDER — HYDROCORTISONE 1 % EX CREA
TOPICAL_CREAM | Freq: Three times a day (TID) | CUTANEOUS | Status: DC
  Administered 2015-10-26 – 2015-10-27 (×3): via TOPICAL
  Filled 2015-10-26: qty 28

## 2015-10-26 MED ORDER — PREDNISONE 50 MG PO TABS
50.0000 mg | ORAL_TABLET | Freq: Every day | ORAL | Status: DC
  Administered 2015-10-27: 50 mg via ORAL
  Filled 2015-10-26: qty 1

## 2015-10-26 NOTE — Progress Notes (Signed)
Pt to evaluate patient for discharge, consult order placed as per dr order.

## 2015-10-26 NOTE — Care Management (Signed)
Spoke with Maurine [facility].  Patient does receive aide services for bath three times a week from "from hospice".  Maurine MinisterDennis thinks it is "palliative care service."  Patient does have a medic alert pendent that she can press for assistance within the [facility] community.  She does not receive any other assistance with from community on a routine basis. Discussed the need to assess for need of of home 02 during  progression.  [facility]. If patient requires transition to "assisted living" level of care, there are beds available but she would have to  move.

## 2015-10-26 NOTE — Care Management Important Message (Signed)
Important Message  Patient Details  Name: Zoe Hughes MRN: [id number] Date of Birth: 1919/06/08   Medicare Important Message Given:  Yes    Olegario MessierKathy A Reyli Schroth 10/26/2015, 11:22 AM

## 2015-10-26 NOTE — Evaluation (Signed)
Physical Therapy Evaluation Patient Details Name: Zoe Hughes MRN: [id number] DOB: 07-04-1918 Today's Date: 10/26/2015   History of Present Illness  Pt is a 82 y.o. F admitted to hospital for pneumonia. Pt has hx of A-Fib, hypothyroidism, HTN, and COPD. Pt lives in [facility] by herself.   Clinical Impression  Pt is a pleasant 82 y.o. F admitted to hospital for pneumonia. Prior to admission, pt lived alone at [facility]. Pt performed all ADLs independently with RW, except her daughter drove her. Pt awake and oriented during evaluation. Pt demonstrated good B UE and LE strength. Pt able to transfer from sit to stand using RW and CGA. Pt able to ambulate approx 350 ft using RW and CGA, requiring cues to keep walker straight. Pt demonstrates deficits in mobility and use of DME. Pt would benefit from further skilled PT to address deficits; recommend pt receive home health PT after discharge from acute hospitalization.     Follow Up Recommendations Home health PT    Equipment Recommendations       Recommendations for Other Services       Precautions / Restrictions Precautions Precautions: Fall Restrictions Weight Bearing Restrictions: No      Mobility  Bed Mobility               General bed mobility comments: Pt up in chair at start of evaluation  Transfers Overall transfer level: Needs assistance Equipment used: Rolling walker (2 wheeled) Transfers: Sit to/from Stand Sit to Stand: Min guard         General transfer comment: Pt able to transfer from recliner using RW and CGA. Pt required cues regarding hand placement on walker and increased time to upright posture.   Ambulation/Gait Ambulation/Gait assistance: Min guard Ambulation Distance (Feet): 300 Feet Assistive device: Rolling walker (2 wheeled) Gait Pattern/deviations: Step-through pattern;Drifts right/left     General Gait Details: Pt able to ambulate around nurses' station using RW and CGA. Pt  demonstrates slow step-through gait pattern. Pt demonstrated slight deviations to L and R with RW. Pt demonstrates good endurance. O2 remained at 92% during ambulation on room air.   Stairs            Wheelchair Mobility    Modified Rankin (Stroke Patients Only)       Balance Overall balance assessment: Modified Independent (Pt able to maintain standing balance w/ RW)                                           Pertinent Vitals/Pain Pain Assessment: No/denies pain    Home Living Family/patient expects to be discharged to:: Private residence Living Arrangements: Alone Available Help at Discharge: Family Type of Home: House Home Access: Level entry     Home Layout: One level Home Equipment: Environmental consultant - 2 wheels;Toilet riser Additional Comments: Pt has daughter in the area who drives her.     Prior Function Level of Independence: Independent with assistive device(s)         Comments: Pt performs all ADLs independently, with her daughter driving her places. Pt uses rollator walker to ambulate.      Hand Dominance        Extremity/Trunk Assessment   Upper Extremity Assessment: RUE deficits/detail;LUE deficits/detail RUE Deficits / Details: R UE grossly 4/5 strength     LUE Deficits / Details: L UE grossly 4/5 strength  Lower Extremity Assessment: RLE deficits/detail;LLE deficits/detail RLE Deficits / Details: R LE grossly 4/5 strength LLE Deficits / Details: L LE grossly 4/5 strength     Communication   Communication: No difficulties  Cognition Arousal/Alertness: Awake/alert Behavior During Therapy: WFL for tasks assessed/performed Overall Cognitive Status: Within Functional Limits for tasks assessed                      General Comments      Exercises        Assessment/Plan    PT Assessment Patient needs continued PT services  PT Diagnosis Difficulty walking;Abnormality of gait;Generalized weakness   PT Problem List  Decreased strength;Decreased mobility;Decreased knowledge of use of DME  PT Treatment Interventions DME instruction;Gait training;Therapeutic exercise   PT Goals (Current goals can be found in the Care Plan section) Acute Rehab PT Goals Patient Stated Goal: to return home PT Goal Formulation: With patient Time For Goal Achievement: 11/09/15 Potential to Achieve Goals: Good    Frequency Min 2X/week   Barriers to discharge        Co-evaluation               End of Session Equipment Utilized During Treatment: Gait belt Activity Tolerance: Patient tolerated treatment well Patient left: in chair;with call bell/phone within reach;with chair alarm set           Time: [id number] PT Time Calculation (min) (ACUTE ONLY): 19 min   Charges:         PT G Codes:        Zoe Hughes 10/26/2015, 4:27 PM ,Zoe Hughes, SPT

## 2015-10-26 NOTE — Progress Notes (Signed)
Electrolyte Supplementation CONSULT NOTE - INITIAL   Pharmacy Consult for Potassium Supplementation Indication: Hypokalemia  Allergies  Allergen Reactions  . Clarithromycin Diarrhea  . Aspirin Other (See Comments)    Other reaction(s): Blood Disorder-thin blood too much when patient takes too much or high doses of aspirin.  . Tetanus Toxoid Rash    Patient Measurements: Height: 5\' 2"  (157.5 cm) Weight: 120 lb 3.2 oz (54.522 kg) IBW/kg (Calculated) : 50.1   Vital Signs: Temp: 97.3 F (36.3 C) (04/25 0451) Temp Source: Oral (04/25 0451) BP: 125/61 mmHg (04/25 0451) Pulse Rate: 87 (04/25 0451) Intake/Output from previous day: 04/24 0701 - 04/25 0700 In: 120 [P.O.:120] Out: 700 [Urine:700] Intake/Output from this shift: Total I/O In: -  Out: 500 [Urine:500]  Labs:  Recent Labs  10/23/15 1742 10/24/15 0455 10/25/15 1446 10/26/15 0407  WBC 11.2* 11.4*  --   --   HGB 12.9 13.7  --   --   HCT 39.6 40.8  --   --   PLT 185 176  --   --   CREATININE 0.71 0.74  --  0.74  MG  --   --  2.1  --    Estimated Creatinine Clearance: 33.3 mL/min (by C-G formula based on Cr of 0.74).   Microbiology: Recent Results (from the past 720 hour(s))  Blood culture (routine x 2)     Status: None (Preliminary result)   Collection Time: 10/23/15  5:42 PM  Result Value Ref Range Status   Specimen Description BLOOD RIGHT ANTECUBITAL  Final   Special Requests BOTTLES DRAWN AEROBIC AND ANAEROBIC  4CC  Final   Culture NO GROWTH < 24 HOURS  Final   Report Status PENDING  Incomplete  Blood culture (routine x 2)     Status: None (Preliminary result)   Collection Time: 10/23/15  5:42 PM  Result Value Ref Range Status   Specimen Description BLOOD LEFT ARM  Final   Special Requests BOTTLES DRAWN AEROBIC AND ANAEROBIC  6CC  Final   Culture NO GROWTH < 24 HOURS  Final   Report Status PENDING  Incomplete  Urine culture     Status: None (Preliminary result)   Collection Time: 10/23/15  6:45 PM   Result Value Ref Range Status   Specimen Description URINE, RANDOM  Final   Special Requests NONE  Final   Culture HOLDING FOR POSSIBLE PATHOGEN  Final   Report Status PENDING  Incomplete    Medical History: Past Medical History  Diagnosis Date  . A-fib (HCC)   . Hypothyroid   . Hypertension     Medications:  Scheduled:  . aspirin EC  81 mg Oral QODAY  . ceFEPime (MAXIPIME) IV  2 g Intravenous Q24H  . diltiazem  300 mg Oral Daily  . enoxaparin (LOVENOX) injection  40 mg Subcutaneous Q24H  . ipratropium-albuterol  3 mL Nebulization Q6H  . latanoprost  1 drop Right Eye QHS  . levothyroxine  88 mcg Oral QAC breakfast  . loratadine  10 mg Oral Daily  . methylPREDNISolone (SOLU-MEDROL) injection  40 mg Intravenous Daily  . pneumococcal 23 valent vaccine  0.5 mL Intramuscular Tomorrow-1000  . polyethylene glycol  17 g Oral Daily  . sodium chloride flush  3 mL Intravenous Q12H  . sodium chloride flush  3 mL Intravenous Q12H  . vancomycin  500 mg Intravenous Q24H    Assessment: Pharmacy consulted to supplement potassium in a 82 yo female admitted with pneumonia.    K: 3.4 (  4/23), Mag pending  Goal of Therapy:  K: 3.5-5.1 Mag: 1.7-2.4  Plan:  Potassium WNL. Will recheck all electrolytes tomorrow with AM labs.   Carola Frost, Pharm.D., BCPS Clinical Pharmacist 10/26/2015,5:32 AM

## 2015-10-26 NOTE — Progress Notes (Signed)
Patient ID: Zoe Hughes, female   DOB: Sep 17, 1918, 82 y.o.   MRN: [id number] [facility]   PATIENT NAME: Zoe Hughes    MR#:  [id number]  DATE OF BIRTH:  [birth year]  SUBJECTIVE:   Patient is doing very well this morning. No shortness of breath.   REVIEW OF SYSTEMS:   Review of Systems  Constitutional: Negative for fever, chills and weight loss.  HENT: Negative for ear discharge, ear pain and nosebleeds.   Eyes: Negative for blurred vision, pain and discharge.  Respiratory: Positive for cough. Negative for sputum production, shortness of breath, wheezing and stridor.   Cardiovascular: Negative for chest pain, palpitations, orthopnea and PND.  Gastrointestinal: Negative for nausea, vomiting, abdominal pain and diarrhea.  Genitourinary: Negative for urgency and frequency.  Musculoskeletal: Negative for back pain and joint pain.  Neurological: Negative for sensory change, speech change, focal weakness and weakness.  Psychiatric/Behavioral: Negative for depression and hallucinations. The patient is not nervous/anxious.    Tolerating Diet:  yes Tolerating PT: pending  DRUG ALLERGIES:   Allergies  Allergen Reactions  . Clarithromycin Diarrhea  . Aspirin Other (See Comments)    Other reaction(s): Blood Disorder-thin blood too much when patient takes too much or high doses of aspirin.  . Tetanus Toxoid Rash    VITALS:  Blood pressure 125/61, pulse 87, temperature 97.3 F (36.3 C), temperature source Oral, resp. rate 16, height  (1.575 m), weight 54.522 kg (120 lb 3.2 oz), SpO2 96 %.  PHYSICAL EXAMINATION:   Physical Exam  Constitutional: She is oriented to person, place, and time and well-developed, well-nourished, and in no distress. No distress.  HENT:  Head: Normocephalic.  Eyes: No scleral icterus.  Neck: Normal range of motion. Neck supple. No JVD present. No tracheal deviation present.  Cardiovascular: Normal rate,  regular rhythm and normal heart sounds.  Exam reveals no gallop and no friction rub.   No murmur heard. Pulmonary/Chest: Effort normal. No respiratory distress. She has no wheezes. She has no rales. She exhibits no tenderness.  Coarse bilateral  Abdominal: Soft. Bowel sounds are normal. She exhibits no distension and no mass. There is no tenderness. There is no rebound and no guarding.  Musculoskeletal: Normal range of motion. She exhibits no edema.  Neurological: She is alert and oriented to person, place, and time.  Skin: Skin is warm. No rash noted. No erythema.  Psychiatric: Affect and judgment normal.      LABORATORY PANEL:  CBC  Recent Labs Lab 10/24/15 0455  WBC 11.4*  HGB 13.7  HCT 40.8  PLT 176    Chemistries   Recent Labs Lab 10/25/15 1446 10/26/15 0407  NA  --  138  K  --  4.1  CL  --  101  CO2  --  28  GLUCOSE  --  148*  BUN  --  30*  CREATININE  --  0.74  CALCIUM  --  9.2  MG 2.1  --    Cardiac Enzymes  Recent Labs Lab 10/23/15 1742  TROPONINI 0.06*   RADIOLOGY:  Dg Chest 1 View  10/25/2015  CLINICAL DATA:  Followup congestive heart failure. Atrial fibrillation. EXAM: CHEST 1 VIEW COMPARISON:  10/24/2015 FINDINGS: Cardiomegaly remains stable. Diffuse heterogeneous areas of pulmonary airspace disease seen bilaterally show mild improvement since previous study, particularly in the perihilar regions. This could be due to improving edema or multilobar pneumonia. Probable small bilateral pleural effusions and left lower lobe atelectasis  versus consolidation show no significant change. IMPRESSION: Mild improvement in heterogeneous bilateral airspace disease compared to prior study. Stable small bilateral pleural effusions and left lower lobe atelectasis versus consolidation. Stable cardiomegaly. Electronically Signed   By: Zoe RosenthalJohn  Hughes M.D.   On: 10/25/2015 10:55   ASSESSMENT AND PLAN:  Zoe Hughes is a 82 y.o. female with a known history of chronic  atrial fibrillation, hypertension, COPD, hypothyroidism and recent hospitalization in February for pneumonia presents to the ED complaining of shortness of breath cough and wheezing.   1. sepsis due to  due to recurrent multifocal pneumonia:  Patient is improving.  Continue cefepime and vancomycin. Continue to Wean oxygen as tolerated No signs of aspiration as per speech consult. Continue Regular; thin liquids; general aspiration precautions   2. Hypothyroidism continue Synthroid  3. Acute on chronic Atrial fibrillation with poor heart rate control in the setting of sepsis: Heart rates are improved. Continue oral Cardizem  4. Anxiety disorder continue trazodone and alprazolam  5. COPD with acute exacerbation: Change IV steroids to oral steroids. Into me to taper steroids.    6. Hypokalemia: This was repleted.   Management plans discussed with the patient and she is in agreement.  CODE STATUS: Full   DVT Prophylaxis: Lovenox   TOTAL TIME TAKING CARE OF THIS PATIENT: 28 minutes.  >50% time spent on counselling and coordination of care  POSSIBLE D/C IN tomorrow, DEPENDING ON CLINICAL CONDITION.  Note: This dictation was prepared with Dragon dictation along with smaller phrase technology. Any transcriptional errors that result from this process are unintentional.  Abdulkadir Emmanuel M.D on 10/26/2015 at 12:16 PM  Between 7am to 6pm - Pager - [phone]  After 6pm go to [email or web address] - password [facility]  Office  [phone]  CC: Primary care physician; Mick SellFITZGERALD, DAVID P, MD

## 2015-10-26 NOTE — Progress Notes (Signed)
Bedside report received at this time, patient presently resting in the chair, patient remains alert and oriented, denies pain . No c/o pain or discomfort at this time.

## 2015-10-27 LAB — MAGNESIUM: Magnesium: 2.3 mg/dL (ref 1.7–2.4)

## 2015-10-27 LAB — CBC
HEMATOCRIT: 38.8 % (ref 35.0–47.0)
Hemoglobin: 12.9 g/dL (ref 12.0–16.0)
MCH: 29.4 pg (ref 26.0–34.0)
MCHC: 33.3 g/dL (ref 32.0–36.0)
MCV: 88.2 fL (ref 80.0–100.0)
Platelets: 226 10*3/uL (ref 150–440)
RBC: 4.39 MIL/uL (ref 3.80–5.20)
RDW: 14.8 % — AB (ref 11.5–14.5)
WBC: 9.9 10*3/uL (ref 3.6–11.0)

## 2015-10-27 LAB — PHOSPHORUS: Phosphorus: 3.3 mg/dL (ref 2.5–4.6)

## 2015-10-27 LAB — BASIC METABOLIC PANEL
Anion gap: 10 (ref 5–15)
BUN: 26 mg/dL — ABNORMAL HIGH (ref 6–20)
CALCIUM: 8.8 mg/dL — AB (ref 8.9–10.3)
CO2: 25 mmol/L (ref 22–32)
CREATININE: 0.69 mg/dL (ref 0.44–1.00)
Chloride: 98 mmol/L — ABNORMAL LOW (ref 101–111)
GFR calc Af Amer: >60 mL/min (ref >60)
GFR calc non Af Amer: >60 mL/min (ref >60)
GLUCOSE: 151 mg/dL — AB (ref 65–99)
Potassium: 4.2 mmol/L (ref 3.5–5.1)
Sodium: 133 mmol/L — ABNORMAL LOW (ref 135–145)

## 2015-10-27 MED ORDER — PREDNISONE 10 MG PO TABS: 10.0000 mg | ORAL_TABLET | Freq: Every day | ORAL | Status: DC

## 2015-10-27 MED ORDER — FLEET ENEMA 7-19 GM/118ML RE ENEM
1.0000 | ENEMA | Freq: Once | RECTAL | Status: AC
  Administered 2015-10-27: 1 via RECTAL

## 2015-10-27 MED ORDER — LEVOFLOXACIN 750 MG PO TABS: 750.0000 mg | ORAL_TABLET | Freq: Every day | ORAL | Status: DC

## 2015-10-27 MED ORDER — BISACODYL 10 MG RE SUPP
10.0000 mg | Freq: Once | RECTAL | Status: AC
  Administered 2015-10-27: 10 mg via RECTAL
  Filled 2015-10-27 (×2): qty 1

## 2015-10-27 NOTE — Progress Notes (Signed)
Room air. A fib. Pt does not report any pain. Home health was set up. A & O. BM today. IV and tele removed. Discharge instructions given to pt. Prescription given to pt. Pt has no further concerns at thsi time. Daughter to take back to [facility].

## 2015-10-27 NOTE — Care Management (Addendum)
Patient for discharge today [facility] level of care.  Orders for present for home health nursing and physical therapy.  [facility]  Patient does not qualify for home 02 per primary nurse.  Left message with patient's daughter Ferd GlassingBetty Euliss and [facility] to discuss and coordinate discharge.  [facility] reported that agency is not providing aide service or palliative care services.  Found that patient is not followed by any other agency.  Pays privately for personal care services provided by contracting agency.  Notified [facility] of return with home health services.

## 2015-10-27 NOTE — Discharge Summary (Signed)
[employer] [facility] [place] [facility]   PATIENT NAME: Zoe Hughes    MR#:  [id number]  DATE OF BIRTH:  03-30-1919  DATE OF ADMISSION:  10/23/2015 ADMITTING PHYSICIAN: Auburn Bilberry, MD  DATE OF DISCHARGE: 10/27/2015  PRIMARY CARE PHYSICIAN: Mick Sell, MD    ADMISSION DIAGNOSIS:  Healthcare-associated pneumonia [J18.9] Sepsis, due to unspecified organism (HCC) [A41.9]  DISCHARGE DIAGNOSIS:  Active Problems:   Pneumonia   SECONDARY DIAGNOSIS:   Past Medical History  Diagnosis Date  . A-fib (HCC)   . Hypothyroid   . Hypertension     HOSPITAL COURSE:   82 y.o. female with a known history of chronic atrial fibrillation, hypertension, COPD, hypothyroidism and recent hospitalization in February for pneumonia presents to the ED complaining of shortness of breath cough and wheezing.   1. Sepsis due to due to recurrent multifocal pneumonia: Sepsis has resolved. She has clinically improved and is off of Oxygen. Lung sounds are clear this morning. She was on cefepime and vancomycin during hospital stay and will b e discharged on Levaquin for 5 more days. There are no signs of aspiration as per speech consult. Continue Regular; thin liquids with general aspiration precautions   2. Hypothyroidism: She may continue Synthroid.  3. Acute on chronic Atrial fibrillation. Initially she presented  with poor heart rate control in the setting of sepsis:She was placed on DILTIAZAM drip and as her heart rate improved this was weaned off. She will continue oral Cardizem  4. Anxiety disorder: She willcontinue trazodone and alprazolam.  5. COPD with acute exacerbation: She will be discharged on oral taper steroids.   6. Hypokalemia: This was repleted.    DISCHARGE CONDITIONS AND DIET:   Stable on regular diet  CONSULTS OBTAINED:     DRUG ALLERGIES:   Allergies  Allergen Reactions  . Clarithromycin Diarrhea  . Aspirin Other (See Comments)    Other  reaction(s): Blood Disorder-thin blood too much when patient takes too much or high doses of aspirin.  . Tetanus Toxoid Rash    DISCHARGE MEDICATIONS:   Current Discharge Medication List    START taking these medications   Details  predniSONE (DELTASONE) 10 MG tablet Take 1 tablet (10 mg total) by mouth daily with breakfast. 50 mg PO (ORAL)  x 2 days 40 mg PO (ORAL)  x 2 days 30 mg PO  (ORAL)  x 2 days 20 mg PO  (ORAL) x 2 days 10 mg PO  (ORAL) x 2 days then stop Qty: 36 tablet, Refills: 0      CONTINUE these medications which have CHANGED   Details  levofloxacin (LEVAQUIN) 750 MG tablet Take 1 tablet (750 mg total) by mouth daily. Qty: 5 tablet, Refills: 0      CONTINUE these medications which have NOT CHANGED   Details  acetaminophen (TYLENOL) 500 MG tablet Take 1 tablet (500 mg total) by mouth every 6 (six) hours as needed for mild pain. Qty: 30 tablet, Refills: 0    albuterol (PROVENTIL) (2.5 MG/3ML) 0.083% nebulizer solution Take 3 mLs (2.5 mg total) by nebulization every 4 (four) hours as needed for wheezing or shortness of breath. Qty: 75 mL, Refills: 12    ALPRAZolam (XANAX) 0.25 MG tablet Take 0.25 mg by mouth 2 (two) times daily as needed for anxiety.    aspirin EC 81 MG tablet Take 81 mg by mouth every other day.    diltiazem (CARDIZEM CD) 300 MG 24 hr capsule Take 300 mg by  mouth daily.    latanoprost (XALATAN) 0.005 % ophthalmic solution Place 1 drop into the right eye at bedtime.     levothyroxine (SYNTHROID, LEVOTHROID) 88 MCG tablet Take 88 mcg by mouth daily before breakfast.    hydrocortisone (ANUSOL-HC) 2.5 % rectal cream Place rectally 3 (three) times daily. Qty: 30 g, Refills: 0      STOP taking these medications     loratadine (CLARITIN) 10 MG tablet               Today   CHIEF COMPLAINT:   Patient doing well this am except feels constipated   VITAL SIGNS:  Blood pressure 150/64, pulse 90, temperature 97.3 F (36.3 C),  temperature source Oral, resp. rate 17, height  (1.575 m), weight 54.522 kg (120 lb 3.2 oz), SpO2 93 %.   REVIEW OF SYSTEMS:  Review of Systems  Constitutional: Negative for fever, chills and malaise/fatigue.  HENT: Negative for ear discharge, ear pain, hearing loss, nosebleeds and sore throat.   Eyes: Negative for blurred vision and pain.  Respiratory: Negative for cough, hemoptysis, shortness of breath and wheezing.   Cardiovascular: Negative for chest pain, palpitations and leg swelling.  Gastrointestinal: Positive for constipation. Negative for nausea, vomiting, abdominal pain, diarrhea and blood in stool.  Genitourinary: Negative for dysuria.  Musculoskeletal: Negative for back pain.  Neurological: Negative for dizziness, tremors, speech change, focal weakness, seizures and headaches.  Endo/Heme/Allergies: Does not bruise/bleed easily.  Psychiatric/Behavioral: Negative for depression, suicidal ideas and hallucinations.     PHYSICAL EXAMINATION:  GENERAL:  82 y.o.-year-old patient lying in the bed with no acute distress.  NECK:  Supple, no jugular venous distention. No thyroid enlargement, no tenderness.  LUNGS: Normal breath sounds bilaterally, no wheezing, rales,rhonchi  No use of accessory muscles of respiration.  CARDIOVASCULAR: S1, S2 normal. No murmurs, rubs, or gallops.  ABDOMEN: Soft, non-tender, non-distended. Bowel sounds present. No organomegaly or mass.  EXTREMITIES: No pedal edema, cyanosis, or clubbing.  PSYCHIATRIC: The patient is alert and oriented x 3.  SKIN: No obvious rash, lesion, or ulcer.   DATA REVIEW:   CBC  Recent Labs Lab 10/27/15 0516  WBC 9.9  HGB 12.9  HCT 38.8  PLT 226    Chemistries   Recent Labs Lab 10/27/15 0516  NA 133*  K 4.2  CL 98*  CO2 25  GLUCOSE 151*  BUN 26*  CREATININE 0.69  CALCIUM 8.8*  MG 2.3    Cardiac Enzymes  Recent Labs Lab 10/23/15 1742  TROPONINI 0.06*    Microbiology Results   @  RADIOLOGY:  Dg Chest 1 View  10/25/2015  CLINICAL DATA:  Followup congestive heart failure. Atrial fibrillation. EXAM: CHEST 1 VIEW COMPARISON:  10/24/2015 FINDINGS: Cardiomegaly remains stable. Diffuse heterogeneous areas of pulmonary airspace disease seen bilaterally show mild improvement since previous study, particularly in the perihilar regions. This could be due to improving edema or multilobar pneumonia. Probable small bilateral pleural effusions and left lower lobe atelectasis versus consolidation show no significant change. IMPRESSION: Mild improvement in heterogeneous bilateral airspace disease compared to prior study. Stable small bilateral pleural effusions and left lower lobe atelectasis versus consolidation. Stable cardiomegaly. Electronically Signed   By: Myles Rosenthal M.D.   On: 10/25/2015 10:55      Management plans discussed with the patient and she is in agreement. Stable for discharge with [facility]  Patient should follow up with PCP in 1 week  CODE STATUS:     Code Status Orders  Start     Ordered   10/23/15 1938  Full code   Continuous     10/23/15 1938    Code Status History    Date Active Date Inactive Code Status Order ID Comments User Context   08/22/2015  4:50 PM 08/25/2015  4:41 PM Full Code [id number]  Enedina FinnerSona Patel, MD Inpatient    Advance Directive Documentation        Most Recent Value   Type of Advance Directive  Healthcare Power of Attorney   Pre-existing out of facility DNR order (yellow form or pink MOST form)     "MOST" Form in Place?        TOTAL TIME TAKING CARE OF THIS PATIENT: 35 minutes.    Note: This dictation was prepared with Dragon dictation along with smaller phrase technology. Any transcriptional errors that result from this process are unintentional.  Marquitta Persichetti M.D on 10/27/2015 at 8:39 AM  Between 7am to 6pm - Pager - [phone] After 6pm go to [email or web address] - Social research officer, governmentpassword [email or web address]  [facility]   Office  [phone]  CC: Primary care physician; Mick SellFITZGERALD, DAVID P, MD

## 2015-10-27 NOTE — Progress Notes (Signed)
Electrolyte Supplementation CONSULT NOTE - INITIAL   Pharmacy Consult for Potassium Supplementation Indication: Hypokalemia  Allergies  Allergen Reactions  . Clarithromycin Diarrhea  . Aspirin Other (See Comments)    Other reaction(s): Blood Disorder-thin blood too much when patient takes too much or high doses of aspirin.  . Tetanus Toxoid Rash    Patient Measurements: Height:  (157.5 cm) Weight: 120 lb 3.2 oz (54.522 kg) IBW/kg (Calculated) : 50.1   Vital Signs: Temp: 97.3 F (36.3 C) (04/26 0415) Temp Source: Oral (04/26 0415) BP: 150/64 mmHg (04/26 0415) Pulse Rate: 90 (04/26 0415) Intake/Output from previous day: 04/25 0701 - 04/26 0700 In: 360 [P.O.:360] Out: 1100 [Urine:1100] Intake/Output from this shift: Total I/O In: -  Out: 500 [Urine:500]  Labs:  Recent Labs  10/25/15 1446 10/26/15 0407 10/27/15 0516  WBC  --   --  9.9  HGB  --   --  12.9  HCT  --   --  38.8  PLT  --   --  226  CREATININE  --  0.74 0.69  MG 2.1  --  2.3  PHOS  --   --  3.3   Estimated Creatinine Clearance: 33.3 mL/min (by C-G formula based on Cr of 0.69).   Microbiology: Recent Results (from the past 720 hour(s))  Blood culture (routine x 2)     Status: None (Preliminary result)   Collection Time: 10/23/15  5:42 PM  Result Value Ref Range Status   Specimen Description BLOOD RIGHT ANTECUBITAL  Final   Special Requests BOTTLES DRAWN AEROBIC AND ANAEROBIC  4CC  Final   Culture NO GROWTH 3 DAYS  Final   Report Status PENDING  Incomplete  Blood culture (routine x 2)     Status: None (Preliminary result)   Collection Time: 10/23/15  5:42 PM  Result Value Ref Range Status   Specimen Description BLOOD LEFT ARM  Final   Special Requests BOTTLES DRAWN AEROBIC AND ANAEROBIC  6CC  Final   Culture NO GROWTH 3 DAYS  Final   Report Status PENDING  Incomplete  Urine culture     Status: None   Collection Time: 10/23/15  6:45 PM  Result Value Ref Range Status   Specimen  Description URINE, RANDOM  Final   Special Requests NONE  Final   Culture MULTIPLE SPECIES PRESENT, SUGGEST RECOLLECTION  Final   Report Status 10/26/2015 FINAL  Final    Medical History: Past Medical History  Diagnosis Date  . A-fib (HCC)   . Hypothyroid   . Hypertension     Medications:  Scheduled:  . aspirin EC  81 mg Oral QODAY  . ceFEPime (MAXIPIME) IV  2 g Intravenous Q24H  . diltiazem  300 mg Oral Daily  . enoxaparin (LOVENOX) injection  40 mg Subcutaneous Q24H  . hydrocortisone cream   Topical TID  . ipratropium-albuterol  3 mL Nebulization Q6H  . latanoprost  1 drop Right Eye QHS  . levothyroxine  88 mcg Oral QAC breakfast  . loratadine  10 mg Oral Daily  . pneumococcal 23 valent vaccine  0.5 mL Intramuscular Tomorrow-1000  . polyethylene glycol  17 g Oral Daily  . predniSONE  50 mg Oral Q breakfast  . sodium chloride flush  3 mL Intravenous Q12H  . sodium chloride flush  3 mL Intravenous Q12H  . vancomycin  500 mg Intravenous Q24H    Assessment: Pharmacy consulted to supplement potassium in a 82 yo female admitted with pneumonia.  K: 3.4 (4/23), Mag pending  Goal of Therapy:  K: 3.5-5.1 Mag: 1.7-2.4  Plan:  Electrolytes WNL. Will recheck all electrolytes tomorrow with AM labs.   Carola FrostNathan A Jemari Hallum, Pharm.D., BCPS Clinical Pharmacist 10/27/2015,6:53 AM

## 2015-10-28 LAB — CULTURE, BLOOD (ROUTINE X 2)
CULTURE: NO GROWTH
Culture: NO GROWTH

## 2016-03-31 ENCOUNTER — Inpatient Hospital Stay (HOSPITAL_COMMUNITY)
Admission: EM | Admit: 2016-03-31 | Discharge: 2016-04-03 | DRG: 065 | Disposition: A | Discharge status: Skilled Nursing Facility | Payer: Medicare Other | Attending: Family Medicine | Admitting: Family Medicine

## 2016-03-31 ENCOUNTER — Emergency Department (HOSPITAL_COMMUNITY): Payer: Medicare Other

## 2016-03-31 ENCOUNTER — Encounter (HOSPITAL_COMMUNITY): Payer: Self-pay | Admitting: Emergency Medicine

## 2016-03-31 DIAGNOSIS — I6350 Cerebral infarction due to unspecified occlusion or stenosis of unspecified cerebral artery: Secondary | ICD-10-CM | POA: Diagnosis not present

## 2016-03-31 DIAGNOSIS — I69915 Cognitive social or emotional deficit following unspecified cerebrovascular disease: Secondary | ICD-10-CM

## 2016-03-31 DIAGNOSIS — G459 Transient cerebral ischemic attack, unspecified: Secondary | ICD-10-CM | POA: Diagnosis not present

## 2016-03-31 DIAGNOSIS — I481 Persistent atrial fibrillation: Secondary | ICD-10-CM | POA: Diagnosis not present

## 2016-03-31 DIAGNOSIS — E039 Hypothyroidism, unspecified: Secondary | ICD-10-CM | POA: Diagnosis present

## 2016-03-31 DIAGNOSIS — I69392 Facial weakness following cerebral infarction: Secondary | ICD-10-CM

## 2016-03-31 DIAGNOSIS — R414 Neurologic neglect syndrome: Secondary | ICD-10-CM

## 2016-03-31 DIAGNOSIS — E782 Mixed hyperlipidemia: Secondary | ICD-10-CM | POA: Diagnosis not present

## 2016-03-31 DIAGNOSIS — R29704 NIHSS score 4: Secondary | ICD-10-CM | POA: Diagnosis present

## 2016-03-31 DIAGNOSIS — I63411 Cerebral infarction due to embolism of right middle cerebral artery: Principal | ICD-10-CM

## 2016-03-31 DIAGNOSIS — I63231 Cerebral infarction due to unspecified occlusion or stenosis of right carotid arteries: Secondary | ICD-10-CM

## 2016-03-31 DIAGNOSIS — I10 Essential (primary) hypertension: Secondary | ICD-10-CM | POA: Diagnosis not present

## 2016-03-31 DIAGNOSIS — I4819 Other persistent atrial fibrillation: Secondary | ICD-10-CM

## 2016-03-31 DIAGNOSIS — G8194 Hemiplegia, unspecified affecting left nondominant side: Secondary | ICD-10-CM | POA: Diagnosis present

## 2016-03-31 DIAGNOSIS — E876 Hypokalemia: Secondary | ICD-10-CM | POA: Diagnosis not present

## 2016-03-31 DIAGNOSIS — I639 Cerebral infarction, unspecified: Secondary | ICD-10-CM | POA: Diagnosis not present

## 2016-03-31 DIAGNOSIS — I482 Chronic atrial fibrillation, unspecified: Secondary | ICD-10-CM

## 2016-03-31 DIAGNOSIS — E785 Hyperlipidemia, unspecified: Secondary | ICD-10-CM

## 2016-03-31 DIAGNOSIS — I671 Cerebral aneurysm, nonruptured: Secondary | ICD-10-CM | POA: Diagnosis present

## 2016-03-31 DIAGNOSIS — I480 Paroxysmal atrial fibrillation: Secondary | ICD-10-CM | POA: Diagnosis present

## 2016-03-31 DIAGNOSIS — R29810 Facial weakness: Secondary | ICD-10-CM | POA: Diagnosis present

## 2016-03-31 DIAGNOSIS — Z7951 Long term (current) use of inhaled steroids: Secondary | ICD-10-CM | POA: Diagnosis not present

## 2016-03-31 DIAGNOSIS — Z66 Do not resuscitate: Secondary | ICD-10-CM | POA: Diagnosis present

## 2016-03-31 DIAGNOSIS — R471 Dysarthria and anarthria: Secondary | ICD-10-CM | POA: Diagnosis not present

## 2016-03-31 LAB — APTT: aPTT: 30 seconds (ref 24–36)

## 2016-03-31 LAB — CBC
HCT: 46.1 % — ABNORMAL HIGH (ref 36.0–46.0)
Hemoglobin: 15.1 g/dL — ABNORMAL HIGH (ref 12.0–15.0)
MCH: 28.3 pg (ref 26.0–34.0)
MCHC: 32.8 g/dL (ref 30.0–36.0)
MCV: 86.5 fL (ref 78.0–100.0)
Platelets: 224 10*3/uL (ref 150–400)
RBC: 5.33 MIL/uL — ABNORMAL HIGH (ref 3.87–5.11)
RDW: 14 % (ref 11.5–15.5)
WBC: 8.5 10*3/uL (ref 4.0–10.5)

## 2016-03-31 LAB — I-STAT CHEM 8, ED
BUN: 17 mg/dL (ref 6–20)
Calcium, Ion: 1.02 mmol/L — ABNORMAL LOW (ref 1.15–1.40)
Chloride: 101 mmol/L (ref 101–111)
Creatinine, Ser: 0.8 mg/dL (ref 0.44–1.00)
Glucose, Bld: 108 mg/dL — ABNORMAL HIGH (ref 65–99)
HCT: 47 % — ABNORMAL HIGH (ref 36.0–46.0)
Hemoglobin: 16 g/dL — ABNORMAL HIGH (ref 12.0–15.0)
Potassium: 3.4 mmol/L — ABNORMAL LOW (ref 3.5–5.1)
Sodium: 139 mmol/L (ref 135–145)
TCO2: 26 mmol/L (ref 0–100)

## 2016-03-31 LAB — COMPREHENSIVE METABOLIC PANEL
ALT: 19 U/L (ref 14–54)
AST: 21 U/L (ref 15–41)
Albumin: 4.3 g/dL (ref 3.5–5.0)
Alkaline Phosphatase: 133 U/L — ABNORMAL HIGH (ref 38–126)
Anion gap: 10 (ref 5–15)
BUN: 15 mg/dL (ref 6–20)
CO2: 23 mmol/L (ref 22–32)
Calcium: 9 mg/dL (ref 8.9–10.3)
Chloride: 103 mmol/L (ref 101–111)
Creatinine, Ser: 0.83 mg/dL (ref 0.44–1.00)
GFR calc Af Amer: >60 mL/min (ref >60)
GFR calc non Af Amer: 58 mL/min — ABNORMAL LOW (ref >60)
Glucose, Bld: 109 mg/dL — ABNORMAL HIGH (ref 65–99)
Potassium: 3.4 mmol/L — ABNORMAL LOW (ref 3.5–5.1)
Sodium: 136 mmol/L (ref 135–145)
Total Bilirubin: 0.9 mg/dL (ref 0.3–1.2)
Total Protein: 6.7 g/dL (ref 6.5–8.1)

## 2016-03-31 LAB — PROTIME-INR
INR: 1.16
Prothrombin Time: 14.8 seconds (ref 11.4–15.2)

## 2016-03-31 LAB — CBG MONITORING, ED: Glucose-Capillary: 108 mg/dL — ABNORMAL HIGH (ref 65–99)

## 2016-03-31 LAB — I-STAT TROPONIN, ED: TROPONIN I, POC: 0.03 ng/mL (ref 0.00–0.08)

## 2016-03-31 LAB — DIFFERENTIAL
BASOS PCT: 0 %
Basophils Absolute: 0 10*3/uL (ref 0.0–0.1)
Eosinophils Absolute: 0.1 10*3/uL (ref 0.0–0.7)
Eosinophils Relative: 1 %
LYMPHS ABS: 2.6 10*3/uL (ref 0.7–4.0)
Lymphocytes Relative: 30 %
MONO ABS: 0.7 10*3/uL (ref 0.1–1.0)
MONOS PCT: 8 %
NEUTROS ABS: 5.1 10*3/uL (ref 1.7–7.7)
Neutrophils Relative %: 61 %

## 2016-03-31 MED ORDER — ONDANSETRON HCL 4 MG/2ML IJ SOLN: 4.0000 mg | Freq: Three times a day (TID) | INTRAMUSCULAR | Status: AC | PRN

## 2016-03-31 MED ORDER — STROKE: EARLY STAGES OF RECOVERY BOOK
Freq: Once | Status: AC
  Administered 2016-03-31: 22:00:00

## 2016-03-31 MED ORDER — LORATADINE 10 MG PO TABS
10.0000 mg | ORAL_TABLET | Freq: Every day | ORAL | Status: DC
  Administered 2016-04-01 – 2016-04-03 (×3): 10 mg via ORAL
  Filled 2016-03-31 (×3): qty 1

## 2016-03-31 MED ORDER — SODIUM CHLORIDE 0.9 % IV SOLN
INTRAVENOUS | Status: AC
  Administered 2016-03-31: 22:00:00 via INTRAVENOUS

## 2016-03-31 MED ORDER — HEPARIN SODIUM (PORCINE) 5000 UNIT/ML IJ SOLN
5000.0000 [IU] | Freq: Three times a day (TID) | INTRAMUSCULAR | Status: DC
  Administered 2016-03-31 – 2016-04-03 (×8): 5000 [IU] via SUBCUTANEOUS
  Filled 2016-03-31 (×9): qty 1

## 2016-03-31 MED ORDER — ASPIRIN EC 81 MG PO TBEC
81.0000 mg | DELAYED_RELEASE_TABLET | Freq: Every day | ORAL | Status: DC
  Administered 2016-03-31 – 2016-04-03 (×4): 81 mg via ORAL
  Filled 2016-03-31 (×4): qty 1

## 2016-03-31 MED ORDER — POTASSIUM CHLORIDE CRYS ER 20 MEQ PO TBCR
40.0000 meq | EXTENDED_RELEASE_TABLET | Freq: Once | ORAL | Status: AC
  Administered 2016-03-31: 40 meq via ORAL
  Filled 2016-03-31: qty 2

## 2016-03-31 MED ORDER — LEVOTHYROXINE SODIUM 88 MCG PO TABS
88.0000 ug | ORAL_TABLET | Freq: Every day | ORAL | Status: DC
  Administered 2016-04-01 – 2016-04-03 (×3): 88 ug via ORAL
  Filled 2016-03-31 (×3): qty 1

## 2016-03-31 MED ORDER — SENNOSIDES-DOCUSATE SODIUM 8.6-50 MG PO TABS
1.0000 | ORAL_TABLET | Freq: Every day | ORAL | Status: DC
  Administered 2016-03-31 – 2016-04-01 (×2): 1 via ORAL
  Filled 2016-03-31 (×2): qty 1

## 2016-03-31 MED ORDER — PANTOPRAZOLE SODIUM 40 MG PO TBEC
40.0000 mg | DELAYED_RELEASE_TABLET | Freq: Every day | ORAL | Status: DC
  Administered 2016-04-01 – 2016-04-03 (×3): 40 mg via ORAL
  Filled 2016-03-31 (×3): qty 1

## 2016-03-31 MED ORDER — ACETAMINOPHEN 325 MG PO TABS: 650.0000 mg | ORAL_TABLET | ORAL | Status: DC | PRN

## 2016-03-31 MED ORDER — DILTIAZEM HCL ER COATED BEADS 180 MG PO CP24
300.0000 mg | ORAL_CAPSULE | Freq: Every day | ORAL | Status: DC
  Administered 2016-04-01 – 2016-04-03 (×3): 300 mg via ORAL
  Filled 2016-03-31 (×3): qty 1

## 2016-03-31 MED ORDER — FLUTICASONE PROPIONATE 50 MCG/ACT NA SUSP
2.0000 | Freq: Every day | NASAL | Status: DC
  Administered 2016-04-02: 2 via NASAL
  Filled 2016-03-31: qty 16

## 2016-03-31 MED ORDER — ACETAMINOPHEN 650 MG RE SUPP: 650.0000 mg | RECTAL | Status: DC | PRN

## 2016-03-31 NOTE — Progress Notes (Signed)
Patient arrived to [id number] AAOx4 from [facility] [place]. No neuro deficits at this time. Tele placed and will continue to monitor. Shir Bergman, [place] ScrapeSarah E, RN

## 2016-03-31 NOTE — ED Notes (Signed)
Printed EKG handed off to Dr. Radford PaxBeaton.

## 2016-03-31 NOTE — Consult Note (Signed)
Neurology Consult Note  Reason for Consultation: CODE STROKE  Requesting provider: Nelva Nay, MD  CC: Patient herself is without complaint but noted to have left-sided weakness  HPI: This is a 89-yo RH woman who presents via EMS to [facility] ED as a CODE STROKE. She lives independently in an assisted living facility. Her daughter was with her at 1150 when she noted the abrupt development of left-sided weakness and slurred speech. 911 was notified and the patient was transported to the ED for further evaluation. Medics reported that she was slumped to the left with head turned to the right with slurred speech. Her symptoms, particularly her speech, fluctuated during transport. She was taken for an emergent CT of the head that did not reveal any acute abnormality. Initially she appeared to have a total left hemineglect with right head and gaze preference that improved somewhat in the ED. NIHSS was 4 (1 for facial droop, 1 for LUE drift [this was likely more related to her neglect, however], and 2 for neglect). Her son was at the bedside. Due to improvement with now relatively minor deficits and her age, the decision was made not to proceed with tPA.   LKW: 1150 NIHSS score: 4 tPA given?: No, improved symptoms, d/w son  She has a history of atrial fibrillation that is treated with aspirin 81 mg every other day. Her son states that her cognition is fully intact. She uses a walker for support while ambulating. No reported history of prior stroke.   PMH:  Past Medical History:  Diagnosis Date  . A-fib (HCC)   . Hypertension   . Hypothyroid     PSH:  Past Surgical History:  Procedure Laterality Date  . ABDOMINAL HYSTERECTOMY    . CHOLECYSTECTOMY      Family history: History reviewed. No pertinent family history.  Social history:  Social History   Social History  . Marital status: Divorced    Spouse name: N/A  . Number of children: N/A  . Years of education: N/A   Occupational  History  . Not on file.   Social History Main Topics  . Smoking status: Never Smoker  . Smokeless tobacco: Never Used  . Alcohol use No  . Drug use: No  . Sexual activity: Not on file   Other Topics Concern  . Not on file   Social History Narrative  . No narrative on file    Current inpatient meds:  No current facility-administered medications for this encounter.    Current Outpatient Prescriptions  Medication Sig Dispense Refill  . acetaminophen (TYLENOL) 500 MG tablet Take 1 tablet (500 mg total) by mouth every 6 (six) hours as needed for mild pain. (Patient taking differently: Take 1,000 mg by mouth at bedtime as needed for mild pain or moderate pain (sleep). ) 30 tablet 0  . albuterol (PROVENTIL) (2.5 MG/3ML) 0.083% nebulizer solution Take 3 mLs (2.5 mg total) by nebulization every 4 (four) hours as needed for wheezing or shortness of breath. 75 mL 12  . ALPRAZolam (XANAX) 0.25 MG tablet Take 0.25 mg by mouth 2 (two) times daily as needed for anxiety.    Marland Kitchen diltiazem (CARDIZEM CD) 300 MG 24 hr capsule Take 300 mg by mouth daily.    . hydrocortisone (ANUSOL-HC) 2.5 % rectal cream Place rectally 3 (three) times daily. 30 g 0  . latanoprost (XALATAN) 0.005 % ophthalmic solution Place 1 drop into the right eye at bedtime.     Marland Kitchen levofloxacin (LEVAQUIN) 750  MG tablet Take 1 tablet (750 mg total) by mouth daily. 5 tablet 0  . levothyroxine (SYNTHROID, LEVOTHROID) 88 MCG tablet Take 88 mcg by mouth daily before breakfast.    . predniSONE (DELTASONE) 10 MG tablet Take 1 tablet (10 mg total) by mouth daily with breakfast. 50 mg PO (ORAL)  x 2 days 40 mg PO (ORAL)  x 2 days 30 mg PO  (ORAL)  x 2 days 20 mg PO  (ORAL) x 2 days 10 mg PO  (ORAL) x 2 days then stop 36 tablet 0    Allergies: Allergies  Allergen Reactions  . Clarithromycin Diarrhea  . Aspirin Other (See Comments)    Other reaction(s): Blood Disorder-thin blood too much when patient takes too much or high doses of aspirin.   . Tetanus Toxoid Rash    ROS: As per HPI. A full 14-point review of systems was performed and is otherwise unremarkable.   PE:  BP 172/91   Temp 97.6 F (36.4 C) (Oral)   Resp 16   SpO2 94%   General: WDWN, no acute distress. She does not appear to be aware of her deficits. AAO x4. Speech clear, no dysarthria. No aphasia. Follows commands briskly. Affect is bright with congruent mood. Comportment is normal. She has a mild left hemineglect. She has a right gaze preference but will look to her left when I call her from that side.  HEENT: Normocephalic. Neck supple without LAD. MMM, OP clear. Dentition good. Sclerae anicteric. No conjunctival injection.  CV: Irregularly irregular, no murmur. Carotid pulses full and symmetric, no bruits. Distal pulses 2+ and symmetric.  Lungs: CTAB.  Abdomen: Soft, non-distended, non-tender. Bowel sounds present x4.  Extremities: No C/C/E. Neuro:  CN: Pupils are equal and round. They are symmetrically reactive from 3-->2 mm. She blinks to visual threat x4 but has left visual neglect. EOMI without nystagmus. No reported diplopia. Facial sensation is intact to light touch. There is a mild left central VII pattern of weakness. Hearing is intact to conversational voice. Palate elevates symmetrically and uvula is midline. Voice is normal in tone, pitch and quality. Bilateral SCM and trapezii are 5/5. Tongue is midline with normal bulk and mobility.  Motor: Normal bulk, tone, and strength. No tremor or other abnormal movements. No actual drift. She has difficulty maintaining her left arm and leg in the air; they tend to drift outward.   Sensation: Intact to light touch and pinprick on the right. She withdraws from pain on the left. She has a dense left sensory neglect.   DTRs: 2+, on the right. 3+ on the left. Toes downgoing bilaterally. No pathologic reflexes.  Coordination: Finger-to-nose and heel-to-shin are without dysmetria on the right.They are slower on the  left but without frank dysmetria.   Labs:  Lab Results  Component Value Date   WBC 8.5 03/31/2016   HGB 16.0 (H) 03/31/2016   HCT 47.0 (H) 03/31/2016   PLT 224 03/31/2016   GLUCOSE 108 (H) 03/31/2016   CHOL 140 08/20/2013   TRIG 79 08/20/2013   HDL 46 08/20/2013   LDLCALC 78 08/20/2013   ALT 19 03/31/2016   AST 21 03/31/2016   NA 139 03/31/2016   K 3.4 (L) 03/31/2016   CL 101 03/31/2016   CREATININE 0.80 03/31/2016   BUN 17 03/31/2016   CO2 23 03/31/2016   TSH 4.99 (H) 10/10/2013   INR 1.16 03/31/2016   HGBA1C 5.9 08/20/2013   Troponin-I 0.03  Imaging:  I [facility] personally  and [facility]. This shows moderate chronic small vessel ischemic disease with moderate diffuse generalized atrophy. No obvious acute pathology is noted.   Assessment and Plan:  1. Acute Ischemic Stroke: This is an acute stroke involving the R MCA territory. It is most likely embolic in etiology. Known risk factors for cerebrovascular disease in this patient include afib (on aspirin QOD), HTN, age. Additional workup will be ordered to include MRI brain, MRA of the head, carotid Dopplers, TTE, fasting lipids, and hemoglobin a1c. Further testing will be determined by results from these initial studies. Recommend continuing aspirin, increase to 81 mg daily if tolerated--sounds like she has had excessive bruising in the past with daily dose. Start statin with goal LDL less than 70. Ensure adequate glucose control. Allow permissive hypertension in the acute phase, treating only SBP greater than 220 mmHg and/or DBP greater than 110 mmHg. Avoid fever and hyperglycemia as these can extend the infarct. Avoid hypotonic IVF to minimize exacerbation of post-stroke edema. Initiate rehab services. DVT prophylaxis as needed.   2. Left hemineglect: This is the most striking deficit at this time. She may also have some mild sensory loss but this is overshadowed by neglect on exam. This  is acute, due to stroke. PT/OT/rehab.   3. Left facial droop: This is acute, due to stroke. Mild. Follow.  4. Dysarthria: This was reported initially but she has no significant slurred speech on current exam. Keep NPO until cleared with swallow eval.   5. Anosognosia: She appears to be unaware of her deficits at this time, consistent with R MCA infarct. Follow.   This was discussed with the patient's son who is in agreement with the plan as noted. He was given the opportunity to ask questions and these were addressed to his satisfaction.   I discussed my impression and recommendations with ED MD Dr. Radford PaxBeaton.   Thank you for this consult. Please feel free to call with any questions of concerns. The stroke team will assume care of the patient starting on 04/01/16.

## 2016-03-31 NOTE — ED Notes (Signed)
Patient comes from independent living facility with left sided weakness and facial droop.  Patient was negative for a infarct.  ASPECTS score was 10.  Patient has no deficits at this time.  NIH is a zero at this time.  A&Ox4.  PASSED SWALLOW SCREEN.  No signs of distress at this time.

## 2016-03-31 NOTE — ED Notes (Signed)
Patient was given Malaw[place]urkey sandwich and water.  Passed stroke swallow and does not appear to have facial drooping at this time.  Patient A&Ox4 and breathing regularly at this time. Will continue to monitor.

## 2016-03-31 NOTE — Code Documentation (Signed)
82[facility] at 1302.  Patient from independent living facility where she was witnessed by her daughter to have left sided weakness.  EMS called and activated a code stroke for left facial droop and left sided weakness.  EMS reports patient unable to walk and slurred speech.  Stroke team at the bedside on patient arrival.  Labs drawn and patient cleared for CT by Dr. Radford PaxBeaton.  Patient to CT with team.  CT completed.  Patient at A13.  NIHSS 8, see documentation for details and code stroke times.  Patient with left hemianopsia, mild left facial droop, left arm drift, left hemisensory loss and left sided neglect on exam.  Patient's son at the bedside.  Dr. Roxy Mannsster at the bedside with decision that symptoms are improving and no treatment with tPA at this time.  Patient remains in the window to treat with tPA until 1620 should symptoms worsen.  Bedside handoff with ED RN Marylene LandAngela.

## 2016-03-31 NOTE — H&P (Signed)
Family Medicine Teaching [facility] Admission History and Physical Service Pager: [phone]  Patient name: Zoe Hughes Medical record number: [id number] Date of birth: [birth year] Age: 82 y.o. Gender: female  Primary Care Provider: FITZGERALD, DAVID Demetrius Charity, MD Consultants: Neurology Code Status: DNR (confirmed during admission)  Chief Complaint: Left-Sided Weakness  Assessment and Plan: Zoe Hughes is a 82 y.o. female presenting with L-sided facial droop and LUE and LLE weakness. PMH is significant for a-fib, HTN, hypothyroidism. No previous history of smoking of prior stroke/TIA.  #Trans-ischemic Attack: Left sided weakness, facial droop and dysarthria resolved at time of admission most consistent with TIA. new onset, resolved. Patient has history of a-fib with rate control on diltiazem therefore concern for embolic etiology of stroke . Was seen by neurology who held tPA due to resolution of symptoms. No h/o smoking or prior TIA/strokes. - Admit to inpatient on tele, attending Dr. Lum Babe - neurology consulted, appreciate recs  - MRI and MRA w/o contrast pending - ECHO pending - Carotid dopplers pending - Neuro checks q2h for 12 hrs - Bedside swallow study pending, NPO until passes swallow study  - Cardiac monitor  - Supplement O2 if sat <94% - ASA 81 mg qd (if tolerates, previously taking every other day due to h/o diffuse ecchymosis) - BMET and CBC in AM - Risk stratification A1C and lipid panel pending - OT/PT - Consider speech therapy if fails bedside swallow -permissive HTN until tomorrow morning (approximately 24 hours after initial symptoms); will resume home diltiazem in AM   #Paroxysmal Atrial Fibrillation without RVR: chronic, rate controlled confirmed by EKG on admission, discussed need for anticoagulation in setting of ischemic stroke and Afib. Cardiology said patient is not a candidate for anticoagulation due to age. Also discussed rate control using diltiazem with  Cardiology in setting of recommended permissive hypertension after ischemic stroke. Given that patient has already had resolution of neuro deficits, cardiology recommended patient should continuation of home diltiazem. Troponin neg. Followed by Dr. Lady Gary at [facility] and missed appointment last week due to illness. Prior ECHO in 2015 showed EF 60-65% with moderate LVH. CHADSVASC score of 6 and HASBLED score of 3. Given patient's age, risks of anticoagulation in setting of Afib may outweigh benefits.  - telemetry  - Diltiazem 300 mg daily for a-fib rate control - ECHO pending - Carotid dopplers pending -daily ASA   #Essential Hypertension: slightly elevated but appropriate given recent TIA. BP 150s/70s since admission. - Will continue diltiazem per cardiology recs, see note above  - Cardiac monitoring  #Hypothyroidism: - Levothyroxine 88 mcg daily - TSH pending  FEN/GI: NPO, pending bedside swallow study, Protonix 40 mg daily Prophylaxis: Heparin SQ (DVT prophylaxis ok per neuro note)   Disposition: Admit to FPTS; Resident at [facility] [place], pending PT/OT therapy  History of Present Illness:  Zoe Hughes is a 82 y.o. female presenting with L-sided facial droop, dysarthria and LUE and LLE weakness. PMH is significant for a-fib, HTN, hypothyroidism. No previous history of smoking of prior stroke/TIA.  Patient is a resident at an independent living facility in [place]. She was accompanied by her daughter earlier today who noted patient had sudden onset slurring of speach and left upper extremity weakness around 1150 on 9/29. She was transferred via EMS to [facility] ED. Code stroke was called upon arrival in ED.  Patient was evaluated by stroke team. NIHSS score 4 for facial droop, LUE drift, and L-sided neglect. CT of head was negative for  acute processes. EKG showed a-fib w/o RVR. Due to improvement of symptoms, tPA was held. During examination at 1600, symptoms had  completely resolved and patient was at baseline. Patient reporting that she feels fine and is talking about how she did chair exercises this morning at her independent living facility. Family at bedside reports that she is back to her baseline.   Per discussion with family, patient is cognitively very intact. She ambulates with the assistance of a walker. Performs most of her ADL's without assistance.   Review Of Systems: Per HPI for ROS.  Patient Active Problem List   Diagnosis Date Noted  . TIA (transient ischemic attack) 03/31/2016  . Pneumonia 10/23/2015  . Hyponatremia 08/22/2015    Past Medical History: Past Medical History:  Diagnosis Date  . A-fib (HCC)   . Hypertension   . Hypothyroid     Past Surgical History: Past Surgical History:  Procedure Laterality Date  . ABDOMINAL HYSTERECTOMY    . CHOLECYSTECTOMY      Social History: Social History  Substance Use Topics  . Smoking status: Never Smoker  . Smokeless tobacco: Never Used  . Alcohol use No   Family History: History reviewed. No pertinent family history. Mother has history of strokes.  Allergies and Medications: Allergies  Allergen Reactions  . Clarithromycin Diarrhea  . Aspirin Other (See Comments)    Other reaction(s): Blood Disorder-thin blood too much when patient takes too much or high doses of aspirin.  . Tetanus Toxoid Rash   No current facility-administered medications on file prior to encounter.    Current Outpatient Prescriptions on File Prior to Encounter  Medication Sig Dispense Refill  . acetaminophen (TYLENOL) 500 MG tablet Take 1 tablet (500 mg total) by mouth every 6 (six) hours as needed for mild pain. (Patient taking differently: Take 1,000 mg by mouth at bedtime as needed for mild pain or moderate pain (sleep). ) 30 tablet 0  . ALPRAZolam (XANAX) 0.25 MG tablet Take 0.25 mg by mouth 2 (two) times daily as needed for anxiety.    Marland Kitchen diltiazem (CARDIZEM CD) 300 MG 24 hr capsule Take 300  mg by mouth daily.    Marland Kitchen latanoprost (XALATAN) 0.005 % ophthalmic solution Place 1 drop into the right eye at bedtime.     Marland Kitchen levothyroxine (SYNTHROID, LEVOTHROID) 88 MCG tablet Take 88 mcg by mouth daily before breakfast.    . albuterol (PROVENTIL) (2.5 MG/3ML) 0.083% nebulizer solution Take 3 mLs (2.5 mg total) by nebulization every 4 (four) hours as needed for wheezing or shortness of breath. (Patient not taking: Reported on 03/31/2016) 75 mL 12  . hydrocortisone (ANUSOL-HC) 2.5 % rectal cream Place rectally 3 (three) times daily. (Patient not taking: Reported on 03/31/2016) 30 g 0  . levofloxacin (LEVAQUIN) 750 MG tablet Take 1 tablet (750 mg total) by mouth daily. (Patient not taking: Reported on 03/31/2016) 5 tablet 0  . predniSONE (DELTASONE) 10 MG tablet Take 1 tablet (10 mg total) by mouth daily with breakfast. 50 mg PO (ORAL)  x 2 days 40 mg PO (ORAL)  x 2 days 30 mg PO  (ORAL)  x 2 days 20 mg PO  (ORAL) x 2 days 10 mg PO  (ORAL) x 2 days then stop (Patient not taking: Reported on 03/31/2016) 36 tablet 0    Objective: BP 156/73 (BP Location: Right Arm)   Pulse 78   Temp 97.6 F (36.4 C) (Oral)   Resp 18   SpO2 95%  Exam: General: frail  elderly woman, well nourished, well developed, in no acute distress with non-toxic appearance HEENT: PERRL, EOMI, normocephalic, atraumatic, moist mucous membranes Neck: supple, non-tender without lymphadenopathy CV: irregularly-irregular, without murmurs, rubs, or gallops Lungs: clear to auscultation bilaterally with normal work of breathing Abdomen: soft, non-tender, no masses or organomegaly palpable, normoactive bowel sounds Skin: warm, dry, no rashes or lesions, cap refill < 2 seconds Extremities: warm and well perfused, normal tone Neuro: CNII-XII intact with no slurring of speech, no facial asymmetry, strength 5/5 in all extremities, nose-to-finger with some slowing bilaterally, heel-to-shin normal bilaterally  Psych: alert & oriented to  person, place, and time  Labs and Imaging: CBC BMET   Recent Labs Lab 03/31/16 1305 03/31/16 1312  WBC 8.5  --   HGB 15.1* 16.0*  HCT 46.1* 47.0*  PLT 224  --     Recent Labs Lab 03/31/16 1305 03/31/16 1312  NA 136 139  K 3.4* 3.4*  CL 103 101  CO2 23  --   BUN 15 17  CREATININE 0.83 0.80  GLUCOSE 109* 108*  CALCIUM 9.0  --      CT Head Code Stroke W/O CM (03/31/16) FINDINGS: Brain: Generalized atrophy. Negative for hydrocephalus. Chronic microvascular ischemic change in the white matter.  Negative for acute infarct. Negative for acute hemorrhage or mass. No shift of the midline structures.  Vascular: No hyperdense vessel or unexpected calcification.  Skull: Negative  Sinuses/Orbits: Negative  Other: None  ASPECTS ([place] Stroke Program Early CT Score)  - Ganglionic level infarction (caudate, lentiform nuclei, internal capsule, insula, M1-M3 cortex): 7  - Supraganglionic infarction (M4-M6 cortex): 3  Total score (0-10 with 10 being normal): 10  IMPRESSION: 1. Atrophy and chronic ischemia.  No acute infarct 2. ASPECTS is 10    Wendee Beaversavid J McMullen, DO 03/31/2016, 4:32 PM PGY-1, [facility] FPTS Intern pager: [phone], text pages welcome  Upper Level Addendum:  I have seen and evaluated this patient along with Dr. Abelardo DieselMcMullen and reviewed the above note, making necessary revisions in green.   Marcy Sirenatherine Wallace, D.O. 03/31/2016, 4:41 PM PGY-2, [facility]

## 2016-03-31 NOTE — ED Triage Notes (Signed)
Pt from [facility] via [facility] with c/o left arm weakness, facial droop, with slurred speech starting at 1150 am today.  Pt's daughter was with pt at time of symptoms.  Pt in NAD, A&O.

## 2016-03-31 NOTE — ED Provider Notes (Signed)
[facility]-EMERGENCY DEPT Provider Note   CSN: [id number] Arrival date & time: 03/31/16  1302   An emergency department physician performed an initial assessment on this suspected stroke patient at 1304 (Florencia Zaccaro).  History   Chief Complaint Chief Complaint  Patient presents with  . Code Stroke    HPI Zoe Hughes is a 82 y.o. female.  HPI Patient presents from an dependent living facility in [place] via [facility] with left arm weak and his facial droop.  Some slurred speech that all seems to be improving.  Started around 1150 today.  Patient was a code stroke in was seen by the neurologist and stroke team.  Patient has previous history of atrial fibrillation and high blood pressure.  CT was done which showed no acute finding.  Should symptoms seem to be improving.  Plan will be to admit. Past Medical History:  Diagnosis Date  . A-fib (HCC)   . Hypertension   . Hypothyroid     Patient Active Problem List   Diagnosis Date Noted  . TIA (transient ischemic attack) 03/31/2016  . Persistent atrial fibrillation (HCC)   . Essential hypertension   . Thyroid activity decreased   . Pneumonia 10/23/2015  . Hyponatremia 08/22/2015    Past Surgical History:  Procedure Laterality Date  . ABDOMINAL HYSTERECTOMY    . CHOLECYSTECTOMY      OB History    No data available       Home Medications    Prior to Admission medications   Medication Sig Start Date End Date Taking? Authorizing Provider  acetaminophen (TYLENOL) 500 MG tablet Take 1 tablet (500 mg total) by mouth every 6 (six) hours as needed for mild pain. Patient taking differently: Take 1,000 mg by mouth at bedtime as needed for mild pain or moderate pain (sleep).  09/09/15 09/08/16 Yes Gayla DossEryka A Gayle, MD  ALPRAZolam Prudy Feeler(XANAX) 0.25 MG tablet Take 0.25 mg by mouth 2 (two) times daily as needed for anxiety.   Yes Historical Provider, MD  diltiazem (CARDIZEM CD) 300 MG 24 hr capsule Take 300 mg by mouth daily. 08/06/15  Yes  Historical Provider, MD  fluticasone (FLONASE) 50 MCG/ACT nasal spray Place 2 sprays into both nostrils daily.   Yes Historical Provider, MD  furosemide (LASIX) 20 MG tablet Take 20 mg by mouth.   Yes Historical Provider, MD  latanoprost (XALATAN) 0.005 % ophthalmic solution Place 1 drop into the right eye at bedtime.  08/13/15  Yes Historical Provider, MD  levothyroxine (SYNTHROID, LEVOTHROID) 88 MCG tablet Take 88 mcg by mouth daily before breakfast. 10/12/15  Yes Historical Provider, MD  loratadine (CLARITIN) 10 MG tablet Take 10 mg by mouth daily.   Yes Historical Provider, MD  pantoprazole (PROTONIX) 40 MG tablet Take 40 mg by mouth daily.   Yes Historical Provider, MD  traZODone (DESYREL) 50 MG tablet Take 50 mg by mouth at bedtime as needed for sleep.   Yes Historical Provider, MD  albuterol (PROVENTIL) (2.5 MG/3ML) 0.083% nebulizer solution Take 3 mLs (2.5 mg total) by nebulization every 4 (four) hours as needed for wheezing or shortness of breath. Patient not taking: Reported on 03/31/2016 08/25/15   Enedina FinnerSona Patel, MD  hydrocortisone (ANUSOL-HC) 2.5 % rectal cream Place rectally 3 (three) times daily. Patient not taking: Reported on 03/31/2016 08/25/15   Enedina FinnerSona Patel, MD  levofloxacin (LEVAQUIN) 750 MG tablet Take 1 tablet (750 mg total) by mouth daily. Patient not taking: Reported on 03/31/2016 10/27/15   Adrian SaranSital Mody, MD  predniSONE (  DELTASONE) 10 MG tablet Take 1 tablet (10 mg total) by mouth daily with breakfast. 50 mg PO (ORAL)  x 2 days 40 mg PO (ORAL)  x 2 days 30 mg PO  (ORAL)  x 2 days 20 mg PO  (ORAL) x 2 days 10 mg PO  (ORAL) x 2 days then stop Patient not taking: Reported on 03/31/2016 10/27/15   Adrian Saran, MD    Family History History reviewed. No pertinent family history.  Social History Social History  Substance Use Topics  . Smoking status: Never Smoker  . Smokeless tobacco: Never Used  . Alcohol use No     Allergies   Clarithromycin; Aspirin; and Tetanus  toxoid   Review of Systems Review of Systems  Unable to perform ROS: Acuity of condition     Physical Exam Updated Vital Signs BP (!) 159/67 (BP Location: Right Arm)   Pulse 64   Temp 98.4 F (36.9 C) (Oral)   Resp 16   Ht 5\' 1"  (1.549 m)   Wt 113 lb (51.3 kg)   SpO2 95%   BMI 21.35 kg/m   Physical Exam Physical Exam  Nursing note and vitals reviewed. Constitutional: Sheawake . She appears well-developed and well-nourished. No distress.  HENT:  Head: Normocephalic and atraumatic.  Eyes: Pupils are equal, round, and reactive to light.  Neck: Normal range of motion.  Cardiovascular: Normal rate and intact distal pulses.   Pulmonary/Chest: No respiratory distress.  Abdominal: Normal appearance. She exhibits no distension.  Musculoskeletal: Normal range of motion.  Neurological: She is alert .Marland Kitchen No cranial nerve deficit.  Skin: Skin is warm and dry. No rash noted.      ED Treatments / Results  Labs (all labs ordered are listed, but only abnormal results are displayed) Labs Reviewed  CBC - Abnormal; Notable for the following:       Result Value   RBC 5.33 (*)    Hemoglobin 15.1 (*)    HCT 46.1 (*)    All other components within normal limits  COMPREHENSIVE METABOLIC PANEL - Abnormal; Notable for the following:    Potassium 3.4 (*)    Glucose, Bld 109 (*)    Alkaline Phosphatase 133 (*)    GFR calc non Af Amer 58 (*)    All other components within normal limits  CBC - Abnormal; Notable for the following:    RBC 5.47 (*)    HCT 47.7 (*)    All other components within normal limits  LIPID PANEL - Abnormal; Notable for the following:    HDL 37 (*)    LDL Cholesterol 115 (*)    All other components within normal limits  CBG MONITORING, ED - Abnormal; Notable for the following:    Glucose-Capillary 108 (*)    All other components within normal limits  I-STAT CHEM 8, ED - Abnormal; Notable for the following:    Potassium 3.4 (*)    Glucose, Bld 108 (*)     Calcium, Ion 1.02 (*)    Hemoglobin 16.0 (*)    HCT 47.0 (*)    All other components within normal limits  MRSA PCR SCREENING  PROTIME-INR  APTT  DIFFERENTIAL  BASIC METABOLIC PANEL  TSH  HEMOGLOBIN A1C  I-STAT TROPOININ, ED  CBG MONITORING, ED    EKG  EKG Interpretation  Date/Time:  Friday March 31 2016 13:23:35 EDT Ventricular Rate:  100 PR Interval:    QRS Duration: 104 QT Interval:  353 QTC Calculation: 456 R  Axis:   -77 Text Interpretation:  Atrial fibrillation Left anterior fascicular block LVH with secondary repolarization abnormality Anterior infarct, old Abnormal ekg Confirmed by Radford Pax  MD, Koua Deeg [phone]) on 03/31/2016 1:33:17 PM Also confirmed by Radford Pax  MD, Otha Monical [phone]), editor [place]  CCT, B[place] ([id number])  on 03/31/2016 1:52:13 PM       Radiology Mr Brain Wo Contrast  Result Date: 04/01/2016 CLINICAL DATA:  82 y/o F; transient slurring of speech and left-sided weakness. EXAM: MRI HEAD WITHOUT CONTRAST MRA HEAD WITHOUT CONTRAST TECHNIQUE: Multiplanar, multiecho pulse sequences of the brain and surrounding structures were obtained without intravenous contrast. Angiographic images of the head were obtained using MRA technique without contrast. COMPARISON:  None. FINDINGS: MRI HEAD FINDINGS Brain: Diffusion restriction within the right lateral temporal lobe extending into the temporal operculum and additional focus within the right parietal operculum consistent with acute/early subacute infarction. The areas of infarction and associated T2 FLAIR hyperintensity and minimal local mass effect. There is a background of moderate chronic microvascular ischemic changes and parenchymal volume loss. No abnormal susceptibility hypointensity to indicate intracranial hemorrhage. Vascular: See below. Skull and upper cervical spine: Normal marrow signal. Sinuses/Orbits: Left intra-ocular lens replacement. Mild ethmoid sinus mucosal thickening. Otherwise no abnormal signal of  paranasal sinuses or mastoid air cells. Other: None. MRA HEAD FINDINGS Anterior circulation: Bilateral internal carotid arteries, anterior cerebral arteries, and middle cerebral arteries are patent. 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 Otherwise no occlusion, aneurysm, dissection, or significant stenosis is identified. Posterior circulation: Right dominant vertebrobasilar system. Bilateral V4 segments, bilateral PICA, right AICA, bilateral SCA, and bilateral PCA are patent. No left AICA identified, likely hypoplastic or absent. No occlusion, aneurysm, dissection, or significant stenosis is identified. Anatomic variation: Patent anterior communicating artery and right posterior communicating artery. No left posterior communicating artery identified, likely hypoplastic or absent. IMPRESSION: 1. Right lateral temporal lobe acute/early subacute infarct with additional small focus in the right parietal operculum. No hemorrhage identified. 2. Moderate chronic microvascular ischemic changes and mild parenchymal volume loss of the brain. 3. Mild paranasal sinus disease. 4. No large vessel occlusion or significant stenosis of the circle of Willis. 5. 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 These results will be called to the ordering clinician or representative by the Radiologist Assistant, and communication documented in the PACS or zVision Dashboard. Electronically Signed   By: Mitzi Hansen M.D.   On: 04/01/2016 02:08   Mr Maxine Glenn Head/brain UJ Cm  Result Date: 04/01/2016 CLINICAL DATA:  82 y/o F; transient slurring of speech and left-sided weakness. EXAM: MRI HEAD WITHOUT CONTRAST MRA HEAD WITHOUT CONTRAST TECHNIQUE: Multiplanar, multiecho pulse sequences of the brain and surrounding structures were obtained without intravenous contrast. Angiographic images of the head were obtained using MRA  technique without contrast. COMPARISON:  None. FINDINGS: MRI HEAD FINDINGS Brain: Diffusion restriction within the right lateral temporal lobe extending into the temporal operculum and additional focus within the right parietal operculum consistent with acute/early subacute infarction. The areas of infarction and associated T2 FLAIR hyperintensity and minimal local mass effect. There is a background of moderate chronic microvascular ischemic changes and parenchymal volume loss. No abnormal susceptibility hypointensity to indicate intracranial hemorrhage. Vascular: See below. Skull and upper cervical spine: Normal marrow signal. Sinuses/Orbits: Left intra-ocular lens replacement. Mild ethmoid sinus mucosal thickening. Otherwise no abnormal signal of paranasal sinuses or mastoid air cells. Other: None. MRA HEAD FINDINGS  Anterior circulation: Bilateral internal carotid arteries, anterior cerebral arteries, and middle cerebral arteries are patent. 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 Otherwise no occlusion, aneurysm, dissection, or significant stenosis is identified. Posterior circulation: Right dominant vertebrobasilar system. Bilateral V4 segments, bilateral PICA, right AICA, bilateral SCA, and bilateral PCA are patent. No left AICA identified, likely hypoplastic or absent. No occlusion, aneurysm, dissection, or significant stenosis is identified. Anatomic variation: Patent anterior communicating artery and right posterior communicating artery. No left posterior communicating artery identified, likely hypoplastic or absent. IMPRESSION: 1. Right lateral temporal lobe acute/early subacute infarct with additional small focus in the right parietal operculum. No hemorrhage identified. 2. Moderate chronic microvascular ischemic changes and mild parenchymal volume loss of the brain. 3. Mild paranasal sinus disease. 4. No large  vessel occlusion or significant stenosis of the circle of Willis. 5. 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 These results will be called to the ordering clinician or representative by the Radiologist Assistant, and communication documented in the PACS or zVision Dashboard. Electronically Signed   By: Mitzi Hansen M.D.   On: 04/01/2016 02:08   Ct Head Code Stroke W/o Cm  Result Date: 03/31/2016 CLINICAL DATA:  Code stroke. EXAM: CT HEAD WITHOUT CONTRAST TECHNIQUE: Contiguous axial images were obtained from the base of the skull through the vertex without intravenous contrast. COMPARISON:  CT head 09/09/2015 FINDINGS: Brain: Generalized atrophy. Negative for hydrocephalus. Chronic microvascular ischemic change in the white matter. Negative for acute infarct. Negative for acute hemorrhage or mass. No shift of the midline structures. Vascular: No hyperdense vessel or unexpected calcification. Skull: Negative Sinuses/Orbits: Negative Other: None ASPECTS (Alberta Stroke Program Early CT Score) - Ganglionic level infarction (caudate, lentiform nuclei, internal capsule, insula, M1-M3 cortex): 7 - Supraganglionic infarction (M4-M6 cortex): 3 Total score (0-10 with 10 being normal): 10 IMPRESSION: 1. Atrophy and chronic ischemia.  No acute infarct 2. ASPECTS is 10 These results were called by telephone at the time of interpretation on 03/31/2016 at 1:23 pm to Dr. Roxy Manns, who verbally acknowledged these results. Electronically Signed   By: Marlan Palau M.D.   On: 03/31/2016 13:23    Procedures Procedures (including critical care time)  Medications Ordered in ED Medications  ondansetron (ZOFRAN) injection 4 mg (not administered)  levothyroxine (SYNTHROID, LEVOTHROID) tablet 88 mcg (88 mcg Oral Given 04/01/16 0857)  fluticasone (FLONASE) 50 MCG/ACT nasal spray 2 spray (not administered)  loratadine (CLARITIN) tablet 10 mg (not administered)  pantoprazole (PROTONIX) EC tablet 40 mg  (not administered)  0.9 %  sodium chloride infusion ( Intravenous New Bag/Given 03/31/16 2141)  senna-docusate (Senokot-S) tablet 1 tablet (1 tablet Oral Given 03/31/16 2141)  aspirin EC tablet 81 mg (81 mg Oral Given 03/31/16 2147)  diltiazem (CARDIZEM CD) 24 hr capsule 300 mg (not administered)  acetaminophen (TYLENOL) tablet 650 mg (not administered)    Or  acetaminophen (TYLENOL) suppository 650 mg (not administered)  heparin injection 5,000 Units (5,000 Units Subcutaneous Given 04/01/16 0627)   stroke: mapping our early stages of recovery book ( Does not apply Given 03/31/16 2147)  potassium chloride SA (K-DUR,KLOR-CON) CR tablet 40 mEq (40 mEq Oral Given 03/31/16 2141)     Initial Impression / Assessment and Plan / ED Course  I have reviewed the triage vital signs and the nursing notes.  Pertinent labs & imaging results that were available during my care of the patient were reviewed by  me and considered in my medical decision making (see chart for details).  Clinical Course   CRITICAL CARE  Patient was seen and evaluated by neurology.  They requested she be admitted to the hospitalist service.     Performed by: Nelia Shi Total critical care time: 30 minutes Critical care time was exclusive of separately billable procedures and treating other patients. Critical care was necessary to treat or prevent imminent or life-threatening deterioration. Critical care was time spent personally by me on the following activities: development of treatment plan with patient and/or surrogate as well as nursing, discussions with consultants, evaluation of patient's response to treatment, examination of patient, obtaining history from patient or surrogate, ordering and performing treatments and interventions, ordering and review of laboratory studies, ordering and review of radiographic studies, pulse oximetry and re-evaluation of patient's condition.    Final Clinical Impressions(s) / ED  Diagnoses   Final diagnoses:  Transient cerebral ischemia, unspecified transient cerebral ischemia type  Dysarthria  Dysarthria    New Prescriptions Current Discharge Medication List       Nelva Nay, MD 04/01/16 [phone]

## 2016-04-01 ENCOUNTER — Inpatient Hospital Stay (HOSPITAL_COMMUNITY): Payer: Medicare Other

## 2016-04-01 DIAGNOSIS — I6350 Cerebral infarction due to unspecified occlusion or stenosis of unspecified cerebral artery: Secondary | ICD-10-CM

## 2016-04-01 DIAGNOSIS — I639 Cerebral infarction, unspecified: Secondary | ICD-10-CM

## 2016-04-01 DIAGNOSIS — E785 Hyperlipidemia, unspecified: Secondary | ICD-10-CM

## 2016-04-01 DIAGNOSIS — I482 Chronic atrial fibrillation, unspecified: Secondary | ICD-10-CM

## 2016-04-01 DIAGNOSIS — I4891 Unspecified atrial fibrillation: Secondary | ICD-10-CM

## 2016-04-01 DIAGNOSIS — R471 Dysarthria and anarthria: Secondary | ICD-10-CM

## 2016-04-01 LAB — BASIC METABOLIC PANEL
Anion gap: 7 (ref 5–15)
BUN: 10 mg/dL (ref 6–20)
CHLORIDE: 106 mmol/L (ref 101–111)
CO2: 25 mmol/L (ref 22–32)
CREATININE: 0.66 mg/dL (ref 0.44–1.00)
Calcium: 9.3 mg/dL (ref 8.9–10.3)
GFR calc Af Amer: >60 mL/min (ref >60)
GFR calc non Af Amer: >60 mL/min (ref >60)
GLUCOSE: 97 mg/dL (ref 65–99)
POTASSIUM: 3.9 mmol/L (ref 3.5–5.1)
SODIUM: 138 mmol/L (ref 135–145)

## 2016-04-01 LAB — CBC
HCT: 47.7 % — ABNORMAL HIGH (ref 36.0–46.0)
Hemoglobin: 14.9 g/dL (ref 12.0–15.0)
MCH: 27.2 pg (ref 26.0–34.0)
MCHC: 31.2 g/dL (ref 30.0–36.0)
MCV: 87.2 fL (ref 78.0–100.0)
PLATELETS: 220 10*3/uL (ref 150–400)
RBC: 5.47 MIL/uL — ABNORMAL HIGH (ref 3.87–5.11)
RDW: 13.9 % (ref 11.5–15.5)
WBC: 5.7 10*3/uL (ref 4.0–10.5)

## 2016-04-01 LAB — GLUCOSE, CAPILLARY: Glucose-Capillary: 115 mg/dL — ABNORMAL HIGH (ref 65–99)

## 2016-04-01 LAB — TSH: TSH: 0.706 u[IU]/mL (ref 0.350–4.500)

## 2016-04-01 LAB — MRSA PCR SCREENING: MRSA BY PCR: NEGATIVE

## 2016-04-01 LAB — LIPID PANEL
CHOL/HDL RATIO: 4.7 ratio
Cholesterol: 173 mg/dL (ref 0–200)
HDL: 37 mg/dL — ABNORMAL LOW (ref >40)
LDL Cholesterol: 115 mg/dL — ABNORMAL HIGH (ref 0–99)
Triglycerides: 107 mg/dL (ref <150)
VLDL: 21 mg/dL (ref 0–40)

## 2016-04-01 MED ORDER — PRAVASTATIN SODIUM 40 MG PO TABS
40.0000 mg | ORAL_TABLET | Freq: Every day | ORAL | Status: DC
  Administered 2016-04-01: 40 mg via ORAL
  Filled 2016-04-01 (×2): qty 1

## 2016-04-01 MED ORDER — PRAVASTATIN SODIUM 20 MG PO TABS: 20.0000 mg | ORAL_TABLET | Freq: Every day | ORAL | Status: DC

## 2016-04-01 NOTE — Evaluation (Signed)
Physical Therapy Evaluation Patient Details Name: Zoe Hughes MRN: [id number] DOB: [birth year] Today's Date: 04/01/2016   History of Present Illness  Admitted with Right lateral temporal lobe acute/early subacute infarct with additional small focus in the right parietal operculum. PMH signifcant for: HTN, a-fib and falls.    Clinical Impression  Pt admitted with above diagnosis. Pt currently with functional limitations due to the deficits listed below (see PT Problem List). *Pt will benefit from skilled PT to increase their independence and safety with mobility to DC back to her assisted living facility.  Pt with significant balance deficits prior to admission and history of falls therefor it is difficult to truly assess if pt is at her baseline. Of note, the pts states she feels back to her baseline. Would get at least a HHPT eval to be sure pt is at her baseline level of function when she returns home.        Follow Up Recommendations Home health PT;Supervision for mobility/OOB    Equipment Recommendations  None recommended by PT    Recommendations for Other Services       Precautions / Restrictions Precautions Precautions: Fall Restrictions Weight Bearing Restrictions: No      Mobility  Bed Mobility Overal bed mobility: Independent                Transfers Overall transfer level: Needs assistance Equipment used: Rolling walker (2 wheeled) Transfers: Sit to/from Stand Sit to Stand: Min guard         General transfer comment: took a couple of attempts when first trying to stand. verbal cues for safest hand placement  Ambulation/Gait Ambulation/Gait assistance: Min guard Ambulation Distance (Feet): 120 Feet Assistive device: Rolling walker (2 wheeled) Gait Pattern/deviations: Step-through pattern;Decreased stride length   Gait velocity interpretation: Below normal speed for age/gender    Stairs            Wheelchair Mobility    Modified Rankin  (Stroke Patients Only) Modified Rankin (Stroke Patients Only) Pre-Morbid Rankin Score: Moderate disability Modified Rankin: Moderate disability     Balance Overall balance assessment: History of Falls                                           Pertinent Vitals/Pain Pain Assessment: No/denies pain    Home Living Family/patient expects to be discharged to:: Assisted living               Home Equipment: Walker - 2 wheels;Toilet riser Additional Comments: Facility provides meals,. cleaning, and assists with bathing    Prior Function Level of Independence: Independent with assistive device(s)         Comments: history of falls with injury     Hand Dominance        Extremity/Trunk Assessment   Upper Extremity Assessment: Defer to OT evaluation (pt with OA in hands)           Lower Extremity Assessment: Generalized weakness (at least 3+/5 strength throughout)         Communication   Communication: No difficulties  Cognition Arousal/Alertness: Awake/alert Behavior During Therapy: WFL for tasks assessed/performed Overall Cognitive Status: Within Functional Limits for tasks assessed                      General Comments General comments (skin integrity, edema, etc.): Son present at start of eval  Exercises     Assessment/Plan    PT Assessment Patient needs continued PT services  PT Problem List Decreased balance;Decreased strength;Decreased mobility          PT Treatment Interventions Gait training;Functional mobility training;Therapeutic activities;Balance training;Neuromuscular re-education    PT Goals (Current goals can be found in the Care Plan section)  Acute Rehab PT Goals Patient Stated Goal: to return home PT Goal Formulation: With patient Time For Goal Achievement: 04/08/16 Potential to Achieve Goals: Good    Frequency Min 3X/week   Barriers to discharge        Co-evaluation               End  of Session Equipment Utilized During Treatment: Gait belt Activity Tolerance: Patient tolerated treatment well Patient left: in chair;with call bell/phone within reach;with chair alarm set Nurse Communication: Mobility status         Time: [id number] PT Time Calculation (min) (ACUTE ONLY): 37 min   Charges:   PT Evaluation $PT Eval Moderate Complexity: 1 Procedure PT Treatments $Gait Training: 8-22 mins   PT G CodesDonnella Hughes:        Zoe Hughes 04/01/2016, 12:04 PM  Zoe Hughes, PT  [phone] 04/01/2016

## 2016-04-01 NOTE — Progress Notes (Addendum)
FMTS ATTENDING ADMISSION NOTE Zoe Eniola,MD I  have seen and examined this patient, reviewed their chart. I have discussed this patient with the resident.    Family Medicine Teaching Service Daily Progress Note Intern Pager: [phone]  Patient name: Zoe Hughes Medical record number: [id number] Date of birth: 04/16/1919 Age: 82 y.o. Gender: female  Primary Care Provider: Mick SellFITZGERALD, DAVID P, MD Consultants: Neurology  Code Status: DNR  Pt Overview and Major Events to Date:  9/29: admit for stroke like symptoms  Assessment and Plan: Zoe Billingsauline B Sias is a 82 y.o. female presenting with L-sided facial droop and LUE and LLE weakness. PMH is significant for a-fib, HTN, hypothyroidism. No previous history of smoking of prior stroke/TIA.  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 Per neurology likely embolic in etiology; patient does have history of Afib and is not on anticoagulation due to age. Symptoms resolved.  - neurology consulted, appreciate recs: - ECHO pending - Carotid dopplers pending - Cardiac monitor  - Supplement O2 if sat <94% - ASA 81  - Risk stratification A1C and lipid panel pending - OT/PT - restart Diltiazem 300mg  daily - AM labs pending  #Atrial Fibrillation without RVR: chronic, rate controlled confirmed by EKG on admission, discussed need for anticoagulation in setting of ischemic stroke and Afib. Cardiology said patient is not a candidate for anticoagulation due to age. Also discussed rate control using diltiazem with Cardiology in setting of recommended permissive hypertension after ischemic stroke. Given that patient has already had resolution of neuro deficits, cardiology recommended patient should continuation of home diltiazem. [facility]ath [facility] and missed appointment  last week due to illness. Prior ECHO in 2015 showed EF 60-65% with moderate LVH. CHADSVASC score of 6 and HASBLED score of 3. Given patient's age, risks of anticoagulation in setting of Afib may outweigh benefits.  - telemetry  - Diltiazem 300 mg daily for a-fib rate control - ECHO pending - Carotid dopplers pending - daily ASA   #Essential Hypertension: elevated due to permissive HTN for at least 24 hours  - Will continue diltiazem per cardiology recs  - Cardiac monitoring  #Hypothyroidism: - Levothyroxine 88 mcg daily - TSH pending  FEN/GI: , Protonix 40 mg daily Prophylaxis: Heparin SQ (DVT prophylaxis ok per neuro note)   Disposition: pending evaluation of CVA  Subjective:  Patient doing well. Neuro symptoms resolved yesterday. No new neuro symptoms today.   Objective: Temp:  [97.6 F (36.4 C)-98.5 F (36.9 C)] 97.8 F (36.6 C) (09/30 0502) Pulse Rate:  [42-104] 77 (09/30 0502) Resp:  [12-24] 17 (09/30 0502) BP: (131-178)/(60-94) 161/94 (09/30 0502) SpO2:  [94 %-98 %] 97 % (09/30 0502) Weight:  [51.3 kg (113 lb)] 51.3 kg (113 lb) (09/29 2110) Physical Exam: GEN: NAD, frail, pleasant  CV: irregularly irregular, no murmurs, rubs, or gallops PULM: CTAB, normal effort ABD: Soft, nontender, nondistended, NABS, no organomegaly SKIN: No rash or cyanosis; warm and well-perfused EXTR: No lower extremity edema or calf tenderness PSYCH: Mood and affect euthymic, normal rate and volume of speech NEURO: Awake, alert, no focal deficits grossly, normal speech;   Laboratory:  Recent Labs Lab 03/31/16 1305 03/31/16 1312  WBC 8.5  --   HGB 15.1* 16.0*  HCT 46.1* 47.0*  PLT 224  --     Recent Labs Lab 03/31/16 1305 03/31/16 1312  NA 136  139  K 3.4* 3.4*  CL 103 101  CO2 23  --   BUN 15 17  CREATININE 0.83 0.80  CALCIUM 9.0  --   PROT 6.7  --   BILITOT 0.9  --   ALKPHOS 133*  --   ALT 19  --   AST 21  --   GLUCOSE 109* 108*    Imaging/Diagnostic  Tests: CT head: 9/29: FINDINGS: Brain: Generalized atrophy. Negative for hydrocephalus. Chronic microvascular ischemic change in the white matter.  Negative for acute infarct. Negative for acute hemorrhage or mass. No shift of the midline structures.  Vascular: No hyperdense vessel or unexpected calcification.  Skull: Negative  Sinuses/Orbits: Negative  Other: None  ASPECTS (Alberta Stroke Program Early CT Score)  - Ganglionic level infarction (caudate, lentiform nuclei, internal capsule, insula, M1-M3 cortex): 7  - Supraganglionic infarction (M4-M6 cortex): 3  Total score (0-10 with 10 being normal): 10  IMPRESSION: 1. Atrophy and chronic ischemia.  No acute infarct 2. ASPECTS is 10  MRI/MRA Brain:  FINDINGS: MRI HEAD FINDINGS  Brain: Diffusion restriction within the right lateral temporal lobe extending into the temporal operculum and additional focus within the right parietal operculum consistent with acute/early subacute infarction. The areas of infarction and associated T2 FLAIR hyperintensity and minimal local mass effect. There is a background of moderate chronic microvascular ischemic changes and parenchymal volume loss. No abnormal susceptibility hypointensity to indicate intracranial hemorrhage.  Vascular: See below.  Skull and upper cervical spine: Normal marrow signal.  Sinuses/Orbits: Left intra-ocular lens replacement. Mild ethmoid sinus mucosal thickening. Otherwise no abnormal signal of paranasal sinuses or mastoid air cells.  Other: None.  MRA HEAD FINDINGS  Anterior circulation: Bilateral internal carotid arteries, anterior cerebral arteries, and middle cerebral arteries are patent. 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 Otherwise no occlusion, aneurysm, dissection, or significant stenosis is identified.  Posterior circulation:  Right dominant vertebrobasilar system. Bilateral V4 segments, bilateral PICA, right AICA, bilateral SCA, and bilateral PCA are patent. No left AICA identified, likely hypoplastic or absent. No occlusion, aneurysm, dissection, or significant stenosis is identified.  Anatomic variation: Patent anterior communicating artery and right posterior communicating artery. No left posterior communicating artery identified, likely hypoplastic or absent.  IMPRESSION: 1. Right lateral temporal lobe acute/early subacute infarct with additional small focus in the right parietal operculum. No hemorrhage identified. 2. Moderate chronic microvascular ischemic changes and mild parenchymal volume loss of the brain. 3. Mild paranasal sinus disease. 4. No large vessel occlusion or significant stenosis of the circle of Willis. 5. 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 These results will be called to the ordering clinician or representative by the Radiologist Assistant, and communication documented in the PACS or Vision Dashboard.  Palma Holter, MD 04/01/2016, 6:30 AM PGY-2, [place] [facility] FPTS Intern pager: [phone], text pages welcome

## 2016-04-01 NOTE — Progress Notes (Signed)
STROKE TEAM PROGRESS NOTE   HISTORY OF PRESENT ILLNESS (per record) This is a 82-yo RH woman who presents via EMS to [facility] ED as a CODE STROKE. She lives independently in an assisted living facility. Her daughter was with her at 1150 when she noted the abrupt development of left-sided weakness and slurred speech. 911 was notified and the patient was transported to the ED for further evaluation. Medics reported that she was slumped to the left with head turned to the right with slurred speech. Her symptoms, particularly her speech, fluctuated during transport. She was taken for an emergent CT of the head that did not reveal any acute abnormality. Initially she appeared to have a total left hemineglect with right head and gaze preference that improved somewhat in the ED. NIHSS was 4 (1 for facial droop, 1 for LUE drift [this was likely more related to her neglect, however], and 2 for neglect). Her son was at the bedside. Due to improvement with now relatively minor deficits and her age, the decision was made not to proceed with tPA.   LKW: 1150 NIHSS score: 4 tPA given?: No, improved symptoms, d/w son  She has a history of atrial fibrillation that is treated with aspirin 81 mg every other day. Her son states that her cognition is fully intact. She uses a walker for support while ambulating. No reported history of prior stroke.    SUBJECTIVE (INTERVAL HISTORY) No family is at the bedside. I talked to her son over the phone. Overall she feels her condition is completely resolved. She is on ASA at home but no AC. She and her son new is OK for anticoagulation with Eliquis.    OBJECTIVE Temp:  [97.8 F (36.6 C)-99 F (37.2 C)] 99 F (37.2 C) (09/30 0957) Pulse Rate:  [42-88] 63 (09/30 0957) Cardiac Rhythm: Atrial fibrillation (09/30 0859) Resp:  [15-23] 20 (09/30 0957) BP: (131-178)/(60-94) 142/84 (09/30 0957) SpO2:  [94 %-99 %] 99 % (09/30 0957) Weight:  [51.3 kg (113 lb)] 51.3 kg (113 lb)  (09/29 2110)  CBC:   Recent Labs Lab 03/31/16 1305 03/31/16 1312 04/01/16 0558  WBC 8.5  --  5.7  NEUTROABS 5.1  --   --   HGB 15.1* 16.0* 14.9  HCT 46.1* 47.0* 47.7*  MCV 86.5  --  87.2  PLT 224  --  220    Basic Metabolic Panel:   Recent Labs Lab 03/31/16 1305 03/31/16 1312 04/01/16 0558  NA 136 139 138  K 3.4* 3.4* 3.9  CL 103 101 106  CO2 23  --  25  GLUCOSE 109* 108* 97  BUN 15 17 10   CREATININE 0.83 0.80 0.66  CALCIUM 9.0  --  9.3    Lipid Panel:     Component Value Date/Time   CHOL 173 04/01/2016 0558   CHOL 140 08/20/2013 0612   TRIG 107 04/01/2016 0558   TRIG 79 08/20/2013 0612   HDL 37 (L) 04/01/2016 0558   HDL 46 08/20/2013 0612   CHOLHDL 4.7 04/01/2016 0558   VLDL 21 04/01/2016 0558   VLDL 16 08/20/2013 0612   LDLCALC 115 (H) 04/01/2016 0558   LDLCALC 78 08/20/2013 0612   HgbA1c:  Lab Results  Component Value Date   HGBA1C 5.9 08/20/2013   Urine Drug Screen: No results found for: LABOPIA, COCAINSCRNUR, LABBENZ, AMPHETMU, THCU, LABBARB    IMAGING I have personally reviewed the radiological images below and agree with the radiology interpretations.  Mr Maxine Glenn Head/brain ZO  Cm 04/01/2016 1. Right lateral temporal lobe acute/early subacute infarct with additional small focus in the right parietal operculum. No hemorrhage identified.  2. Moderate chronic microvascular ischemic changes and mild parenchymal volume loss of the brain.  3. Mild paranasal sinus disease.  4. No large vessel occlusion or significant stenosis of the circle of Willis.  5. 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   Ct Head Code Stroke W/o Cm 03/31/2016 1. Atrophy and chronic ischemia.  No acute infarct  2. ASPECTS is 10   TTE pending  CUS pending   PHYSICAL EXAM Temp:  [97.8 F (36.6 C)-99 F (37.2 C)] 99 F (37.2 C) (09/30 0957) Pulse Rate:  [63-88] 63 (09/30 0957) Resp:  [15-23] 20 (09/30 0957) BP: (131-178)/(60-94) 142/84 (09/30  0957) SpO2:  [95 %-99 %] 99 % (09/30 0957) Weight:  [113 lb (51.3 kg)] 113 lb (51.3 kg) (09/29 2110)  General - Well nourished, well developed, in no apparent distress.  Ophthalmologic - Sharp disc margins OU.   Cardiovascular - irregularly irregular heart rate and rhythm.  Mental Status -  Level of arousal and orientation to time, place, and person were intact. Language including expression, naming, repetition, comprehension was assessed and found intact. Fund of Knowledge was assessed and was intact.  Cranial Nerves II - XII - II - Visual field intact OU. III, IV, VI - Extraocular movements intact. V - Facial sensation intact bilaterally. VII - Facial movement intact bilaterally. VIII - Hearing & vestibular intact bilaterally. X - Palate elevates symmetrically. XI - Chin turning & shoulder shrug intact bilaterally. XII - Tongue protrusion intact.  Motor Strength - The patient's strength was normal in all extremities and pronator drift was absent.  Bulk was normal and fasciculations were absent.   Motor Tone - Muscle tone was assessed at the neck and appendages and was normal.  Reflexes - The patient's reflexes were 1+ in all extremities and she had no pathological reflexes.  Sensory - Light touch, temperature/pinprick were assessed and were symmetrical.    Coordination - The patient had normal movements in the hands with no ataxia or dysmetria.  Tremor was absent.  Gait and Station - deferred due to safety concerns.    ASSESSMENT/PLAN Ms. Zoe Hughes is a 82 y.o. female with history of atrial fibrillation on ASA, hypertension, and hypothyroidism presenting with left sided weakness and slurred speech. She did not receive IV t-PA due to improving deficits.  Stroke:  Right MCA infarcts - embolic secondary to atrial fibrillation not on AC.  Resultant  No deficit  MRI - Right lateral temporal lobe acute/early subacute infarct with additional small focus in the right  parietal operculum.  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   Carotid Doppler - pending  2D Echo - pending  LDL - 115  HgbA1c pending  VTE prophylaxis - subcutaneous heparin Diet Heart Room service appropriate? Yes; Fluid consistency: Thin  ASA 81 mg every other day prior to admission, now on aspirin 81 mg daily. Pt and son are OK with Eliquis for stroke prevention. Due to her stroke size, would recommend to start eliquis 2.5mg  bid in 3 days. Once Eliquis started, ASA can be stopped.   Patient counseled to be compliant with her antithrombotic medications  Ongoing aggressive stroke risk factor management  Therapy recommendations: Home health physical therapy recommended. OT evaluation pending.  Disposition: Pending  Afib on ASA  CHADS2-VASc = 6  Recommend eliquis  2.5mg  bid in 3 days given the stroke size  Pt and son are Ok with the plan.   Once Eliquis started, ASA can be discontinued.  Hypertension  Stable  Permissive hypertension (OK if < 180/105) but gradually normalize in 5-7 days  Long-term BP goal normotensive  Hyperlipidemia  Home meds:  No lipid lowering medications prior to admission.  LDL 115, goal < 70  Recommend Pravachol 40 mg daily  Continue statin at discharge  Other Stroke Risk Factors  Advanced age  Other Active Problems  Hypokalemia - corrected  Hospital day # 1   Marvel Plan, MD PhD Stroke Neurology 04/01/2016 3:51 PM      To contact Stroke Continuity provider, please refer to [email or web address]. After hours, contact General Neurology

## 2016-04-02 ENCOUNTER — Other Ambulatory Visit (HOSPITAL_COMMUNITY): Payer: Medicare Other

## 2016-04-02 ENCOUNTER — Inpatient Hospital Stay (HOSPITAL_COMMUNITY): Payer: Medicare Other

## 2016-04-02 DIAGNOSIS — E782 Mixed hyperlipidemia: Secondary | ICD-10-CM

## 2016-04-02 DIAGNOSIS — G459 Transient cerebral ischemic attack, unspecified: Secondary | ICD-10-CM

## 2016-04-02 DIAGNOSIS — I63411 Cerebral infarction due to embolism of right middle cerebral artery: Secondary | ICD-10-CM

## 2016-04-02 DIAGNOSIS — I482 Chronic atrial fibrillation: Secondary | ICD-10-CM

## 2016-04-02 LAB — HEMOGLOBIN A1C
HEMOGLOBIN A1C: 6 % — AB (ref 4.8–5.6)
MEAN PLASMA GLUCOSE: 126 mg/dL

## 2016-04-02 MED ORDER — ASPIRIN 81 MG PO TBEC: 81.0000 mg | DELAYED_RELEASE_TABLET | Freq: Every day | ORAL | 0 refills | Status: AC

## 2016-04-02 MED ORDER — ACETAMINOPHEN 500 MG PO TABS: 1000.0000 mg | ORAL_TABLET | Freq: Every evening | ORAL | Status: DC | PRN

## 2016-04-02 MED ORDER — APIXABAN 2.5 MG PO TABS: 2.5000 mg | ORAL_TABLET | Freq: Two times a day (BID) | ORAL | 0 refills | Status: AC

## 2016-04-02 MED ORDER — PRAVASTATIN SODIUM 40 MG PO TABS: 40.0000 mg | ORAL_TABLET | Freq: Every day | ORAL | 0 refills | Status: AC

## 2016-04-02 NOTE — Evaluation (Signed)
Occupational Therapy Evaluation Patient Details Name: Zoe Hughes MRN: [id number] DOB: 09/25/1918 Today's Date: 04/02/2016    History of Present Illness Admitted with Right lateral temporal lobe acute/early subacute infarct with additional small focus in the right parietal operculum. PMH signifcant for: HTN, a-fib and falls.     Clinical Impression   Pt lives in an independent living facility who provides assist for IADL and supervision 3 times a week for showering. Pt walks with a rollator and sponge bathes and dresses herself. Hx is significant for multiple falls. Pt presents with generalized weakness and impaired standing balance. She seems to prefer the RW to her rollators and may request one for home. Will follow acutely.    Follow Up Recommendations  Home health OT    Equipment Recommendations  Rolling walker   Recommendations for Other Services       Precautions / Restrictions Precautions Precautions: Fall Precaution Comments: pt reports many falls Restrictions Weight Bearing Restrictions: No      Mobility Bed Mobility               General bed mobility comments: pt in chair  Transfers Overall transfer level: Needs assistance Equipment used: Rolling walker (2 wheeled) Transfers: Sit to/from Stand Sit to Stand: Min assist;Min guard         General transfer comment: pt with posterior lean requiring assist x 1, pt reports preferring the RW to her rollator    Balance Overall balance assessment: Needs assistance   Sitting balance-Leahy Scale: Good       Standing balance-Leahy Scale: Poor                              ADL Overall ADL's : Needs assistance/impaired Eating/Feeding: Independent;Sitting   Grooming: Wash/dry hands;Standing;Min guard   Upper Body Bathing: Set up;Sitting   Lower Body Bathing: Sit to/from stand;Min guard   Upper Body Dressing : Set up;Sitting   Lower Body Dressing: Min guard;Sit to/from stand    Toilet Transfer: Minimal assistance;Ambulation;RW;BSC (over toilet)   Toileting- Clothing Manipulation and Hygiene: Sit to/from stand;Minimal assistance Toileting - Clothing Manipulation Details (indicate cue type and reason): pt with posterior lean requiring steadying assist from 3 in 1     Functional mobility during ADLs: Min guard;Rolling walker General ADL Comments: Pt with concerns about taking blood thinner with fall risk, left note for MD and encouraged pt to have conversation about risks and benefits.     Vision     Perception     Praxis      Pertinent Vitals/Pain Pain Assessment: No/denies pain     Hand Dominance Right   Extremity/Trunk Assessment Upper Extremity Assessment Upper Extremity Assessment: Overall WFL for tasks assessed (arthritic changes in hands)   Lower Extremity Assessment Lower Extremity Assessment: Defer to PT evaluation       Communication Communication Communication: No difficulties   Cognition Arousal/Alertness: Awake/alert Behavior During Therapy: WFL for tasks assessed/performed Overall Cognitive Status: Within Functional Limits for tasks assessed                     General Comments       Exercises       Shoulder Instructions      Home Living Family/patient expects to be discharged to:: Private residence (senior independent living) Living Arrangements: Alone Available Help at Discharge: Personal care attendant (3 x a week to help with showers) Type of Home: Independent  living facility       Home Layout: One level     Bathroom Shower/Tub: Producer, television/film/video:  (comfort height)     Home Equipment: Walker - 4 wheels;Hand held shower head;Grab bars - toilet;Grab bars - tub/shower;Adaptive equipment Adaptive Equipment: Reacher Additional Comments: Facility provides meals, cleaning and laundry. Pt has a medical alert button she wears. Facility helps organize her meds      Prior  Functioning/Environment Level of Independence: Needs assistance  Gait / Transfers Assistance Needed: walks with rollator ADL's / Homemaking Assistance Needed: supervised for showers   Comments: hx of L UE fx from falls        OT Problem List: Impaired balance (sitting and/or standing);Decreased knowledge of use of DME or AE   OT Treatment/Interventions: Self-care/ADL training;Patient/family education;Balance training    OT Goals(Current goals can be found in the care plan section) Acute Rehab OT Goals Patient Stated Goal: to return home OT Goal Formulation: With patient Time For Goal Achievement: 04/09/16 Potential to Achieve Goals: Good ADL Goals Pt Will Perform Grooming: with modified independence;standing Pt Will Transfer to Toilet: with modified independence;ambulating;bedside commode (over toilet) Pt Will Perform Toileting - Clothing Manipulation and hygiene: with modified independence;sit to/from stand Additional ADL Goal #1: Pt will perform sponge bathing and dressing at a modified independent level.  OT Frequency: Min 2X/week   Barriers to D/C:            Co-evaluation              End of Session Equipment Utilized During Treatment: Gait belt;Rolling walker  Activity Tolerance: Patient tolerated treatment well Patient left: in chair;with call bell/phone within reach;with chair alarm set;with family/visitor present   Time: 1610-9604 OT Time Calculation (min): 42 min Charges:  OT General Charges $OT Visit: 1 Procedure OT Evaluation $OT Eval Moderate Complexity: 1 Procedure OT Treatments $Self Care/Home Management : 23-37 mins G-Codes:    Evern Bio 04/02/2016, 3:10 PM  [phone]

## 2016-04-02 NOTE — Plan of Care (Signed)
Problem: Safety: Goal: Ability to remain free from injury will improve Outcome: Progressing Pt was ambulating w/walker w/nurse at bedside, and began falling backward but was able to right herself w/nurse at bedside.

## 2016-04-02 NOTE — Progress Notes (Signed)
Family Medicine Teaching Service Daily Progress Note Intern Pager: [phone]  Patient name: Zoe Hughes Medical record number: [id number] Date of birth: 1919/04/08 Age: 82 y.o. Gender: female  Primary Care Provider: Mick Sell, MD Consultants: neurology  Code Status: DNR  Pt Overview and Major Events to Date:  9/29: admit for acute CVA  Assessment and Plan: Zoe Hughes a 82 y.o.femalepresenting with L-sided facial droop and LUE and LLE weakness. PMH is significant for a-fib, HTN, hypothyroidism. No previous history of smoking of prior stroke/TIA.  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 Per neurology likely embolic in etiology; patient does have history of Afib and is not on anticoagulation due to age. Symptoms resolved.  - neurology consulted, appreciate recs: start Eliquis 2.5mg  BID in 3 days (10/3) due to her stroke size. Once Eliquis started ASA can be stopped - ECHO pending - Carotid dopplers pending: preliminary study reports of significant calcific plaques noted at the origin. 1-39% ICA stenosis.  - Cardiac monitoring - Supplement O2 if sat <94% - ASA 81  - Risk stratification A1C pending - OT/PT: HH PT (ordered); OT has not seen patient yet  - Pravastatin 40mg  daily  #Atrial Fibrillation without MHD:[id number], rate controlled confirmed by EKG on admission, discussed need for anticoagulation in setting of ischemic stroke and Afib. Cardiology said patient is not a candidate for anticoagulation due to age. Also discussedrate control using diltiazem with Cardiology in setting of recommendedpermissive hypertension after ischemic stroke. Given that patient has already had resolution of neuro deficits, cardiology recommended patient should continuation of home diltiazem. Followed by Dr. Lady Gary at [facility] and missed appointment last week due to illness. Prior ECHO in 2015 showed EF 60-65% with moderate LVH. CHADSVASC score of 6 and HASBLED score of 3. Given patient's age, risks of anticoagulation in setting of Afib may outweigh benefits.  - telemetry  - Diltiazem 300 mg daily for a-fib rate control - ECHO pending - daily ASA (for now, please note above)   #Essential Hypertension:elevated due to permissive HTN for at least 24 hours  -  diltiazem  - Cardiac monitoring  #Hypothyroidism: TSH 0.706 - Levothyroxine 88 mcg daily  FEN/GI: heart healthy , Protonix 40 mg daily Prophylaxis: Heparin SQ (DVT prophylaxis ok per neuro note)   Disposition: pending final imaging and neurology recommendations.   Subjective:  Patient doing well. No concerns today. No neuro deficits that she has noticed.   Objective: Temp:  [98.1 F (36.7 C)-99.1 F (37.3 C)] 98.1 F (36.7 C) (10/01 0916) Pulse Rate:  [63-86] 86 (10/01 0916) Resp:  [16-20] 18 (10/01 0916) BP: (124-157)/(65-88) 157/68 (10/01 0916) SpO2:  [97 %-99 %] 98 % (10/01 0916) Physical Exam: GEN: NAD, frail, pleasant  CV: irregularly irregular, no murmurs, rubs, or gallops PULM: CTAB, normal effort ABD: Soft, nontender, nondistended, NABS, no organomegaly SKIN: No rash or cyanosis; warm and well-perfused EXTR: No lower extremity edema or calf tenderness PSYCH: Mood and affect euthymic, normal rate and volume of speech NEURO: Awake, alert, no focal deficits, normal speech  Laboratory:  Recent Labs Lab 03/31/16 1305 03/31/16 1312 04/01/16 0558  WBC 8.5  --  5.7  HGB 15.1* 16.0* 14.9  HCT 46.1* 47.0* 47.7*  PLT 224  --  220    Recent Labs Lab 03/31/16 1305 03/31/16 1312 04/01/16 0558  NA 136 139 138  K  3.4* 3.4* 3.9  CL 103 101 106  CO2 23  --  25  BUN 15 17 10   CREATININE 0.83 0.80 0.66  CALCIUM 9.0  --  9.3  PROT 6.7  --   --   BILITOT 0.9  --   --   ALKPHOS 133*  --   --   ALT 19  --   --   AST 21  --    --   GLUCOSE 109* 108* 97   Lipid: cholesterol 173, HDL 37, LDL 115  TSH: 0.706  Imaging/Diagnostic Tests: MRI/MRA: 9/30 FINDINGS: MRI HEAD FINDINGS  Brain: Diffusion restriction within the right lateral temporal lobe extending into the temporal operculum and additional focus within the right parietal operculum consistent with acute/early subacute infarction. The areas of infarction and associated T2 FLAIR hyperintensity and minimal local mass effect. There is a background of moderate chronic microvascular ischemic changes and parenchymal volume loss. No abnormal susceptibility hypointensity to indicate intracranial hemorrhage.  Vascular: See below.  Skull and upper cervical spine: Normal marrow signal.  Sinuses/Orbits: Left intra-ocular lens replacement. Mild ethmoid sinus mucosal thickening. Otherwise no abnormal signal of paranasal sinuses or mastoid air cells.  Other: None.  MRA HEAD FINDINGS  Anterior circulation: Bilateral internal carotid arteries, anterior cerebral arteries, and middle cerebral arteries are patent. 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 Otherwise no occlusion, aneurysm, dissection, or significant stenosis is identified.  Posterior circulation: Right dominant vertebrobasilar system. Bilateral V4 segments, bilateral PICA, right AICA, bilateral SCA, and bilateral PCA are patent. No left AICA identified, likely hypoplastic or absent. No occlusion, aneurysm, dissection, or significant stenosis is identified.  Anatomic variation: Patent anterior communicating artery and right posterior communicating artery. No left posterior communicating artery identified, likely hypoplastic or absent.  IMPRESSION: 1. Right lateral temporal lobe acute/early subacute infarct with additional small focus in the right parietal operculum. No hemorrhage  identified. 2. Moderate chronic microvascular ischemic changes and mild parenchymal volume loss of the brain. 3. Mild paranasal sinus disease. 4. No large vessel occlusion or significant stenosis of the circle of Willis. 5. 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 These results will be called to the ordering clinician or representative by the Radiologist Assistant, and communication documented in the PACS or zVision Dashboard.   Palma HolterKanishka G Jaxin Fulfer, MD 04/02/2016, 9:28 AM PGY-2, [facility] FPTS Intern pager: [phone], text pages welcome

## 2016-04-02 NOTE — Discharge Summary (Signed)
[facility]  Patient name: Zoe Hughes Medical record number: [id number] Date of birth: June 27, 1919 Age: 82 y.o. Gender: female Date of Admission: 03/31/2016  Date of Discharge: 04/03/2016 Admitting Physician: Doreene ElandKehinde T Eniola, MD  Primary Care Provider: Mick SellFITZGERALD, DAVID P, MD Consultants: Neurology  Indication for Hospitalization:  Left-sided facial droop and left upper extremity and left lower extremity weakness  Discharge Diagnoses/Problem List:  Patient Active Problem List   Diagnosis Date Noted  . Cerebrovascular accident (CVA) due to embolism of right middle cerebral artery (HCC)   . Dysarthria   . Cerebrovascular accident (CVA) (HCC)   . Chronic atrial fibrillation (HCC)   . HLD (hyperlipidemia)   . TIA (transient ischemic attack) 03/31/2016  . Persistent atrial fibrillation (HCC)   . Essential hypertension   . Thyroid activity decreased   . Pneumonia 10/23/2015  . Hyponatremia 08/22/2015     Disposition: Discharge to [facility] (assisted living[place]  Discharge Condition: stable, improved  Discharge Exam:  GEN: NAD, frail, pleasant  CV: irregularly irregular, no murmurs, rubs, or gallops PULM: CTAB, normal effort ABD: Soft, nontender, nondistended, NABS, no organomegaly SKIN: No rash or cyanosis; warm and well-perfused EXTR: No lower extremity edema or calf tenderness PSYCH: Mood and affect euthymic, normal rate and volume of speech NEURO: Awake, alert, no focal deficits, normal speech  Brief Hospital Course:  82 year old woman with history of atrial fibrillation on 81 mg aspirin every other day who presented via EMS [facility] as code stroke.  Her daughter noted the abrupt development of left-sided weakness and slurred speech.  On transport medics noted that she was slumped to the left with head turned to the right with slurred speech. She had a CT head that did not reveal any acute  abnormalities. Patient initially appeared to have total left hemicolectomy with right head and gaze preference that impose somewhat in the ED per neurology notes. The choice was made to not proceed with TPA due to her minor deficits and age.  MRI/MRA of the head and brain without contrast showed right lateral temporal lobe acute/early subacute infarct additional small focus in the right parietal operculum.  Patient's A1c was 6.0 and LDL was 115.  Stroke was felt to be embolic in nature and transthoracic echo revealed no valvular source, see full report below.  She was started on Pravachol 40 mg daily and has been on aspirin 81 mg daily since admission.  Neurology recommends Eliquis 2.5 mg twice a day starting on 04/04/16 at which point she can discontinue aspirin.   She was seen by physical therapy who recommend a minimum of 3 times per week to increase independence and safety with mobility.  Also seen by occupational therapy recommended home health occupational therapy 2 times per week and a rolling walker. Patient was prescribed rolling walker which she can leave with.    Issues for Follow Up:  1. Stroke follow up- will start eliquis 2.5mg  BID tomorrow and will discontinue aspirin. 2. Atrial fibrillation- patient will continue diltiazem 300 mg daily for rate control  Significant Procedures: None  Significant Labs and Imaging:   Recent Labs Lab 03/31/16 1305 03/31/16 1312 04/01/16 0558  WBC 8.5  --  5.7  HGB 15.1* 16.0* 14.9  HCT 46.1* 47.0* 47.7*  PLT 224  --  220    Recent Labs Lab 03/31/16 1305 03/31/16 1312 04/01/16 0558  NA 136 139 138  K 3.4* 3.4* 3.9  CL 103  101 106  CO2 23  --  25  GLUCOSE 109* 108* 97  BUN 15 17 10   CREATININE 0.83 0.80 0.66  CALCIUM 9.0  --  9.3  ALKPHOS 133*  --   --   AST 21  --   --   ALT 19  --   --   ALBUMIN 4.3  --   --    Imaging:   MRA head: IMPRESSION: 1. Right lateral temporal lobe acute/early subacute infarct with additional small  focus in the right parietal operculum. No hemorrhage identified. 2. Moderate chronic microvascular ischemic changes and mild parenchymal volume loss of the brain. 3. Mild paranasal sinus disease. 4. No large vessel occlusion or significant stenosis of the circle of Willis. 5. 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 These results will be called to the ordering clinician or representative by the Radiologist Assistant, and communication documented in the PACS or zVision Dashboard.  2D ECHO: Study Conclusions  - Left ventricle: The cavity size was normal. There was moderate   concentric hypertrophy. Systolic function was normal. The   estimated ejection fraction was in the range of 55% to 60%. Wall   motion was normal; there were no regional wall motion   abnormalities. - Aortic valve: Transvalvular velocity was within the normal range.   There was no stenosis. There was no regurgitation. - Mitral valve: Calcified annulus. Transvalvular velocity was   within the normal range. There was no evidence for stenosis.   There was mild regurgitation. - Left atrium: The atrium was moderately dilated. - Right ventricle: The cavity size was normal. Wall thickness was   normal. Systolic function was normal. - Right atrium: The atrium was severely dilated. - Tricuspid valve: There was mild regurgitation. - Pulmonary arteries: Systolic pressure was within the normal   range. PA peak pressure: 30 mm Hg (S).   Results/Tests Pending at Time of Discharge: None  Discharge Medications:    Medication List    STOP taking these medications   hydrocortisone 2.5 % rectal cream Commonly known as:  ANUSOL-HC   levofloxacin 750 MG tablet Commonly known as:  LEVAQUIN   predniSONE 10 MG tablet Commonly known as:  DELTASONE     TAKE these medications   acetaminophen 500 MG tablet Commonly known as:  TYLENOL Take 2 tablets (1,000 mg total) by mouth at bedtime as needed for mild  pain or moderate pain (sleep).   albuterol (2.5 MG/3ML) 0.083% nebulizer solution Commonly known as:  PROVENTIL Take 3 mLs (2.5 mg total) by nebulization every 4 (four) hours as needed for wheezing or shortness of breath.   ALPRAZolam 0.25 MG tablet Commonly known as:  XANAX Take 0.25 mg by mouth 2 (two) times daily as needed for anxiety.   apixaban 2.5 MG Tabs tablet Commonly known as:  ELIQUIS Take 1 tablet (2.5 mg total) by mouth 2 (two) times daily. Start taking on:  04/04/2016   aspirin 81 MG EC tablet Take 1 tablet (81 mg total) by mouth daily.   diltiazem 300 MG 24 hr capsule Commonly known as:  CARDIZEM CD Take 300 mg by mouth daily.   fluticasone 50 MCG/ACT nasal spray Commonly known as:  FLONASE Place 2 sprays into both nostrils daily.   furosemide 20 MG tablet Commonly known as:  LASIX Take 20 mg by mouth.   latanoprost 0.005 % ophthalmic solution Commonly known as:  XALATAN Place 1 drop into the right eye at bedtime.   levothyroxine 88 MCG  tablet Commonly known as:  SYNTHROID, LEVOTHROID Take 88 mcg by mouth daily before breakfast.   loratadine 10 MG tablet Commonly known as:  CLARITIN Take 10 mg by mouth daily.   pantoprazole 40 MG tablet Commonly known as:  PROTONIX Take 40 mg by mouth daily.   pravastatin 40 MG tablet Commonly known as:  PRAVACHOL Take 1 tablet (40 mg total) by mouth daily at 6 PM.   traZODone 50 MG tablet Commonly known as:  DESYREL Take 50 mg by mouth at bedtime as needed for sleep.       Discharge Instructions: Please refer to Patient Instructions section of EMR for full details.  Patient was counseled important signs and symptoms that should prompt return to medical care, changes in medications, dietary instructions, activity restrictions, and follow up appointments.   Follow-Up Appointments: Follow-up Information    MARTIN,NANCY CAROLYN, NP Follow up in 6 week(s).   Specialty:  Family Medicine Why:  stroke follow  up Contact information: [address] [place] [address] [phone]           Renne Musca, MD 04/03/2016, 2:55 PM PGY-1, [facility]

## 2016-04-02 NOTE — Progress Notes (Signed)
VASCULAR LAB PRELIMINARY  PRELIMINARY  PRELIMINARY  PRELIMINARY  Carotid duplex completed.    Preliminary report:  Significant calcific plaque noted at origin ICA. 1-39% ICA stenosis.  Vertebral artery flow is antegrade.   Lottie Siska, RVT 04/02/2016, 8:06 AM

## 2016-04-02 NOTE — Progress Notes (Signed)
STROKE TEAM PROGRESS NOTE   SUBJECTIVE (INTERVAL HISTORY) No family is at the bedside. She is eating lunch in chair. No complains and no acute issue over night. Will start Eliquis Tuesday as planned.    OBJECTIVE Temp:  [98.1 F (36.7 C)-99.1 F (37.3 C)] 98.1 F (36.7 C) (10/01 0916) Pulse Rate:  [69-86] 86 (10/01 0916) Cardiac Rhythm: Atrial fibrillation (09/30 1900) Resp:  [16-20] 18 (10/01 0916) BP: (124-157)/(65-88) 157/68 (10/01 0916) SpO2:  [97 %-98 %] 98 % (10/01 0916)  CBC:   Recent Labs Lab 03/31/16 1305 03/31/16 1312 04/01/16 0558  WBC 8.5  --  5.7  NEUTROABS 5.1  --   --   HGB 15.1* 16.0* 14.9  HCT 46.1* 47.0* 47.7*  MCV 86.5  --  87.2  PLT 224  --  220    Basic Metabolic Panel:   Recent Labs Lab 03/31/16 1305 03/31/16 1312 04/01/16 0558  NA 136 139 138  K 3.4* 3.4* 3.9  CL 103 101 106  CO2 23  --  25  GLUCOSE 109* 108* 97  BUN 15 17 10   CREATININE 0.83 0.80 0.66  CALCIUM 9.0  --  9.3    Lipid Panel:     Component Value Date/Time   CHOL 173 04/01/2016 0558   CHOL 140 08/20/2013 0612   TRIG 107 04/01/2016 0558   TRIG 79 08/20/2013 0612   HDL 37 (L) 04/01/2016 0558   HDL 46 08/20/2013 0612   CHOLHDL 4.7 04/01/2016 0558   VLDL 21 04/01/2016 0558   VLDL 16 08/20/2013 0612   LDLCALC 115 (H) 04/01/2016 0558   LDLCALC 78 08/20/2013 0612   HgbA1c:  Lab Results  Component Value Date   HGBA1C 5.9 08/20/2013   Urine Drug Screen: No results found for: LABOPIA, COCAINSCRNUR, LABBENZ, AMPHETMU, THCU, LABBARB    IMAGING I have personally reviewed the radiological images below and agree with the radiology interpretations.  Mr Maxine GlennMra Head/brain Wo Cm 04/01/2016 1. Right lateral temporal lobe acute/early subacute infarct with additional small focus in the right parietal operculum. No hemorrhage identified.  2. Moderate chronic microvascular ischemic changes and mild parenchymal volume loss of the brain.  3. Mild paranasal sinus disease.  4. No  large vessel occlusion or significant stenosis of the circle of Willis.  5. 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   Ct Head Code Stroke W/o Cm 03/31/2016 1. Atrophy and chronic ischemia.  No acute infarct  2. ASPECTS is 10   TTE pending  CUS Significant calcific plaque noted at origin ICA. 1-39% ICA stenosis.  Vertebral artery flow is antegrade   PHYSICAL EXAM Temp:  [98.1 F (36.7 C)-99.1 F (37.3 C)] 98.1 F (36.7 C) (10/01 0916) Pulse Rate:  [69-86] 86 (10/01 0916) Resp:  [16-20] 18 (10/01 0916) BP: (124-157)/(65-88) 157/68 (10/01 0916) SpO2:  [97 %-98 %] 98 % (10/01 0916)  General - Well nourished, well developed, in no apparent distress.  Ophthalmologic - Sharp disc margins OU.   Cardiovascular - irregularly irregular heart rate and rhythm.  Mental Status -  Level of arousal and orientation to time, place, and person were intact. Language including expression, naming, repetition, comprehension was assessed and found intact. Fund of Knowledge was assessed and was intact.  Cranial Nerves II - XII - II - Visual field intact OU. III, IV, VI - Extraocular movements intact. V - Facial sensation intact bilaterally. VII - Facial movement intact bilaterally. VIII - Hearing & vestibular intact bilaterally. X - Palate elevates  symmetrically. XI - Chin turning & shoulder shrug intact bilaterally. XII - Tongue protrusion intact.  Motor Strength - The patient's strength was normal in all extremities and pronator drift was absent.  Bulk was normal and fasciculations were absent.   Motor Tone - Muscle tone was assessed at the neck and appendages and was normal.  Reflexes - The patient's reflexes were 1+ in all extremities and she had no pathological reflexes.  Sensory - Light touch, temperature/pinprick were assessed and were symmetrical.    Coordination - The patient had normal movements in the hands with no ataxia or dysmetria.  Tremor was absent.  Gait  and Station - deferred due to safety concerns.    ASSESSMENT/PLAN Ms. Zoe Hughes is a 82 y.o. female with history of atrial fibrillation on ASA, hypertension, and hypothyroidism presenting with left sided weakness and slurred speech. She did not receive IV t-PA due to improving deficits.  Stroke:  Right MCA infarcts - embolic secondary to atrial fibrillation not on AC.  Resultant  No deficit  MRI - Right lateral temporal lobe acute/early subacute infarct with additional small focus in the right parietal operculum.  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   Carotid Doppler - Significant calcific plaque noted at origin ICA. 1-39% ICA stenosis.  Vertebral artery flow is antegrade.   2D Echo - pending  LDL - 115  HgbA1c pending  VTE prophylaxis - subcutaneous heparin Diet Heart Room service appropriate? Yes; Fluid consistency: Thin  ASA 81 mg every other day prior to admission, now on aspirin 81 mg daily. Pt and son are OK with Eliquis for stroke prevention. Due to her stroke size, would recommend to start eliquis 2.5mg  bid 04/04/16. Once Eliquis started, ASA can be stopped.   Patient counseled to be compliant with her antithrombotic medications  Ongoing aggressive stroke risk factor management  Therapy recommendations: Home PT/OT.  Disposition: Pending  Afib on ASA  CHADS2-VASc = 6  Recommend eliquis 2.5mg  bid starting 04/04/16 given the stroke size  Pt and son are Ok with the plan.   Once Eliquis started, ASA can be discontinued.  Hypertension  Stable  Permissive hypertension (OK if < 180/105) but gradually normalize in 5-7 days  Long-term BP goal normotensive  Hyperlipidemia  Home meds:  No lipid lowering medications prior to admission.  LDL 115, goal < 70  Now on Pravachol 40 mg daily.  Continue statin at discharge  Other Stroke Risk Factors  Advanced age  Other Active Problems  Hypokalemia -  corrected  Hospital day # 2  Neurology will sign off. Please call with questions. Pt will follow up with Darrol Angel at [facility] in about 6 weeks. Thanks for the consult.  Marvel Plan, MD PhD Stroke Neurology 04/02/2016 12:23 PM      To contact Stroke Continuity provider, please refer to [email or web address]. After hours, contact General Neurology

## 2016-04-03 ENCOUNTER — Inpatient Hospital Stay (HOSPITAL_COMMUNITY): Payer: Medicare Other

## 2016-04-03 DIAGNOSIS — G459 Transient cerebral ischemic attack, unspecified: Secondary | ICD-10-CM

## 2016-04-03 LAB — ECHOCARDIOGRAM COMPLETE
HEIGHTINCHES: 61 in
Weight: 1808 oz

## 2016-04-03 MED ORDER — PERFLUTREN LIPID MICROSPHERE
1.0000 mL | INTRAVENOUS | Status: AC | PRN
  Administered 2016-04-03: 2 mL via INTRAVENOUS
  Filled 2016-04-03: qty 10

## 2016-04-03 MED ORDER — PERFLUTREN LIPID MICROSPHERE
INTRAVENOUS | Status: AC
  Filled 2016-04-03: qty 10

## 2016-04-03 MED ORDER — APIXABAN 2.5 MG PO TABS: 2.5000 mg | ORAL_TABLET | Freq: Two times a day (BID) | ORAL | Status: DC

## 2016-04-03 NOTE — Progress Notes (Signed)
Echocardiogram 2D Echocardiogram has been performed with definity.  Zoe Hughes 04/03/2016, 10:58 AM

## 2016-04-03 NOTE — Progress Notes (Signed)
Occupational Therapy Treatment Patient Details Name: Zoe Hughes MRN: [id number] DOB: 09-16-1918 Today's Date: 04/03/2016    History of present illness Admitted with Right lateral temporal lobe acute/early subacute infarct with additional small focus in the right parietal operculum. PMH signifcant for: HTN, a-fib and falls.     OT comments  This 82 yo female admitted with above is making progress with basic ADLs since eval. She will continue to benefit from acute OT with follow up HHOT once D/C'd.  Follow Up Recommendations  Home health OT    Equipment Recommendations  Other (comment) (RW if she does not already have one)       Precautions / Restrictions Precautions Precautions: Fall Precaution Comments: pt reports many falls Restrictions Weight Bearing Restrictions: No       Mobility Bed Mobility               General bed mobility comments: pt in chair  Transfers Overall transfer level: Needs assistance Equipment used: Rolling walker (2 wheeled) Transfers: Sit to/from Stand Sit to Stand: Min guard         General transfer comment: Pt was able with min guard A ambulate around her room with RW and get up and down from recliner, couch and chair without arms    Balance Overall balance assessment: Needs assistance Sitting-balance support: No upper extremity supported;Feet supported Sitting balance-Leahy Scale: Good     Standing balance support: Single extremity supported;During functional activity   Standing balance comment: reliant on at least 1 UE support in standing                   ADL Overall ADL's : Needs assistance/impaired                     Lower Body Dressing: Min guard;Sit to/from stand   Toilet Transfer: Min guard;Ambulation;RW;BSC (over toilet)   Toileting- Clothing Manipulation and Hygiene: Min guard;Sit to/from stand                          Cognition   Behavior During Therapy: [facility]FL for tasks  assessed/performed Overall Cognitive Status: Within Functional Limits for tasks assessed                                    Pertinent Vitals/ Pain       Pain Assessment: No/denies pain         Frequency  Min 2X/week        Progress Toward Goals  OT Goals(current goals can now be found in the care plan section)  Progress towards OT goals: Progressing toward goals     Plan Discharge plan remains appropriate       End of Session Equipment Utilized During Treatment: Gait belt;Rolling walker   Activity Tolerance Patient tolerated treatment well   Patient Left in chair;with call bell/phone within reach;with chair alarm set   Nurse Communication  (NT: chair alarm turned on)        Time: [id number] OT Time Calculation (min): 21 min  Charges: OT General Charges $OT Visit: 1 Procedure OT Treatments $Self Care/Home Management : 8-22 mins  Evette GeorgesLeonard, Abigayl Hor Eva [phone] 04/03/2016, 1:31 PM

## 2016-04-03 NOTE — [place] FL2 (Signed)
[place] MEDICAID FL2 LEVEL OF CARE SCREENING TOOL     IDENTIFICATION  Patient Name: Zoe Hughes Birthdate: 1918-11-23 Sex: female Admission Date (Current Location): 03/31/2016  [facility] [place]   Facility and Address:  [facility] [place], [address], [place], [place] [address]      Provider Number: [id number]  Attending Physician Name and Address:  Doreene ElandKehinde T Eniola, MD  Relative Name and Phone Number:       Current Level of Care: Hospital Recommended Level of Care: Assisted Living Facility Prior Approval Number:    Date Approved/Denied:   PASRR Number:    Discharge Plan: Domiciliary (Rest home)    Current Diagnoses: Patient Active Problem List   Diagnosis Date Noted  . Cerebrovascular accident (CVA) due to embolism of right middle cerebral artery (HCC)   . Dysarthria   . Cerebrovascular accident (CVA) (HCC)   . Chronic atrial fibrillation (HCC)   . HLD (hyperlipidemia)   . TIA (transient ischemic attack) 03/31/2016  . Persistent atrial fibrillation (HCC)   . Essential hypertension   . Thyroid activity decreased   . Pneumonia 10/23/2015  . Hyponatremia 08/22/2015    Orientation RESPIRATION BLADDER Height & Weight     Self, Time, Situation, Place  Normal Continent Weight: 113 lb (51.3 kg) Height:  5\' 1"  (154.9 cm)  BEHAVIORAL SYMPTOMS/MOOD NEUROLOGICAL BOWEL NUTRITION STATUS      Continent Diet (Heart Healthy, Thin Liquids)  AMBULATORY STATUS COMMUNICATION OF NEEDS Skin   Limited Assist Verbally Normal                       Personal Care Assistance Level of Assistance  Bathing, Feeding, Dressing Bathing Assistance: Limited assistance Feeding assistance: Independent Dressing Assistance: Limited assistance     Functional Limitations Info  Sight, Hearing, Speech Sight Info: Adequate Hearing Info: Adequate Speech Info: Adequate    SPECIAL CARE FACTORS FREQUENCY  PT (By licensed PT), OT (By licensed  OT)     PT Frequency: Home Health  OT Frequency: Home Health            Contractures Contractures Info: Not present    Additional Factors Info  Code Status, Allergies Code Status Info: DNR  Allergies Info: Clarithromycin, Aspirin, Tetanus Toxoid           Discharge Medications: Please see discharge summary for a list of discharge medications. Discharge Medications:    Medication List    STOP taking these medications   hydrocortisone 2.5 % rectal cream Commonly known as:  ANUSOL-HC  levofloxacin 750 MG tablet Commonly known as:  LEVAQUIN  predniSONE 10 MG tablet Commonly known as:  DELTASONE    TAKE these medications   acetaminophen 500 MG tablet Commonly known as:  TYLENOL Take 2 tablets (1,000 mg total) by mouth at bedtime as needed for mild pain or moderate pain (sleep).  albuterol (2.5 MG/3ML) 0.083% nebulizer solution Commonly known as:  PROVENTIL Take 3 mLs (2.5 mg total) by nebulization every 4 (four) hours as needed for wheezing or shortness of breath.  ALPRAZolam 0.25 MG tablet Commonly known as:  XANAX Take 0.25 mg by mouth 2 (two) times daily as needed for anxiety.  apixaban 2.5 MG Tabs tablet Commonly known as:  ELIQUIS Take 1 tablet (2.5 mg total) by mouth 2 (two) times daily. Start taking on:  04/04/2016  aspirin 81 MG EC tablet Take 1 tablet (81 mg total) by mouth daily.  diltiazem 300  MG 24 hr capsule Commonly known as:  CARDIZEM CD Take 300 mg by mouth daily.  fluticasone 50 MCG/ACT nasal spray Commonly known as:  FLONASE Place 2 sprays into both nostrils daily.  furosemide 20 MG tablet Commonly known as:  LASIX Take 20 mg by mouth.  latanoprost 0.005 % ophthalmic solution Commonly known as:  XALATAN Place 1 drop into the right eye at bedtime.  levothyroxine 88 MCG tablet Commonly known as:  SYNTHROID, LEVOTHROID Take 88 mcg by mouth daily before breakfast.  loratadine 10 MG tablet Commonly known as:  CLARITIN Take 10 mg by mouth  daily.  pantoprazole 40 MG tablet Commonly known as:  PROTONIX Take 40 mg by mouth daily.  pravastatin 40 MG tablet Commonly known as:  PRAVACHOL Take 1 tablet (40 mg total) by mouth daily at 6 PM.  traZODone 50 MG tablet Commonly known as:  DESYREL Take 50 mg by mouth at bedtime as needed for sleep.       Relevant Imaging Results:  Relevant Lab Results:   Additional Information SSN:  [id number]  Dede Query, LCSW

## 2016-04-03 NOTE — Discharge Instructions (Signed)
You were in the hospital because you had a stroke which the neurologist thought was caused by the atrial fibrillation (this is called an embolic stroke). You were started on a cholesterol medication. The neurologist would like you to start taking Eliquis (blood thinner) starting on 04/04/16. Stop taking your baby aspirin.  Instead you will start taking Eliquis. You will have the physical therapist come to your home to work on your balance and strength.   Please schedule a follow up appointment with your primary doctor, Dr. Sampson GoonFitzgerald.

## 2016-04-03 NOTE — Progress Notes (Signed)
Echo attempted, pt with doctor.

## 2016-04-03 NOTE — Progress Notes (Signed)
[facility] is returning to [facility] at this time. Transport back to facility per family.   Sim BoastHavy, RN

## 2016-04-03 NOTE — Evaluation (Signed)
Speech Language Pathology Evaluation Patient Details Name: Zoe Hughes MRN: [id number] DOB: 05/10/1919 Today's Date: 04/03/2016 Time: [id number] SLP Time Calculation (min) (ACUTE ONLY): 39 min  Problem List:  Patient Active Problem List   Diagnosis Date Noted  . Cerebrovascular accident (CVA) due to embolism of right middle cerebral artery (HCC)   . Dysarthria   . Cerebrovascular accident (CVA) (HCC)   . Chronic atrial fibrillation (HCC)   . HLD (hyperlipidemia)   . TIA (transient ischemic attack) 03/31/2016  . Persistent atrial fibrillation (HCC)   . Essential hypertension   . Thyroid activity decreased   . Pneumonia 10/23/2015  . Hyponatremia 08/22/2015   Past Medical History:  Past Medical History:  Diagnosis Date  . A-fib (HCC)   . Hypertension   . Hypothyroid    Past Surgical History:  Past Surgical History:  Procedure Laterality Date  . ABDOMINAL HYSTERECTOMY    . CHOLECYSTECTOMY     HPI:  82 yo female Admitted with Right lateral temporal lobe acute/early subacute infarct with additional small focus in the right parietal operculum. PMH signifcant for: HTN, a-fib and falls   Assessment / Plan / Recommendation Clinical Impression  Pt scoring on MOCA was 18/30 - indicative of significant cognitive deficits.  Areas of strength included language, naming, orientation and attention.  Areas of challenges were memory = both storage and retrieval= (pt recalled 3/5 words with category cue, did not recognize 2/5 with multiple choice of 3). Pt reports she has someone who manages her medications at her independent living facility.  Pt reports she enjoys doing puzzles *timing herself* and reading.  Encouraged her to continue to challenge cognitive skills.  Pt reports she does not know if MOCA items would have been better premorbidly but it appears she has support needed in her living environment.  Provided pt with written memory compensation strategies.  No SLP follow up indicated  unless family/staff notes functional changes at her independent living facility. Thanks for this referral.     SLP Assessment  Patient does not need any further Speech Lanaguage Pathology Services    Follow Up Recommendations  None    Frequency and Duration           SLP Evaluation Cognition          Comprehension  Auditory Comprehension Overall Auditory Comprehension: Appears within functional limits for tasks assessed Yes/No Questions: Not tested Commands: Within Functional Limits Conversation: Complex Interfering Components: Hearing EffectiveTechniques: Increased volume Visual Recognition/Discrimination Discrimination: Not tested Reading Comprehension Reading Status: Not tested (pt has reading glasses at home)    Expression Expression Primary Mode of Expression: Verbal Verbal Expression Overall Verbal Expression: Appears within functional limits for tasks assessed Initiation: No impairment Level of Generative/Spontaneous Verbalization: Conversation Repetition: No impairment Naming: No impairment Pragmatics: No impairment Non-Verbal Means of Communication: Not applicable Written Expression Dominant Hand: Right Written Expression: Within Functional Limits   Oral / Motor  Oral Motor/Sensory Function Overall Oral Motor/Sensory Function: Within functional limits Motor Speech Overall Motor Speech: Appears within functional limits for tasks assessed Respiration: Within functional limits Phonation: Normal Resonance: Within functional limits Articulation: Within functional limitis Intelligibility: Intelligible Motor Planning: Witnin functional limits Motor Speech Errors: Not applicable   GO                    Mills KollerKimball, Avalyn Molino Ann Levette Paulick, MS [facility] SLP [phone]

## 2016-04-03 NOTE — Care Management Note (Signed)
Case Management Note  Patient Details  Name: Zoe Hughes MRN: [id number] Date of Birth: Oct 17, 1918  Subjective/Objective:                    Action/Plan: Patient [facility] back to [facility] [place]. Family at bedside and going to transport her home. [facility]. [facility] has their own rehab that can provide the therapy. CM spoke with the patient and she has used this before and wants to use [facility] therapy. Information sent to [facility] per the CSW. Pt also with orders for walker. Pt already has a walker at home. Bedside RN updated.   Expected Discharge Date:                  Expected Discharge Plan:  Home/Self Care  In-House Referral:  Clinical Social Work  Discharge planning Services  CM Consult  Post Acute Care Choice:    Choice offered to:  Patient  DME Arranged:    DME Agency:     HH Arranged:  PT HH Agency:   [facility]  Status of Service:  Completed, signed off  If discussed at [employer] [employer] of [employer] Meetings, dates discussed:    Additional Comments:  Kermit BaloKelli F Jalila Goodnough, RN 04/03/2016, 5:09 PM

## 2016-04-05 LAB — VAS US CAROTID
LCCADDIAS: -15 cm/s
LCCADSYS: -64 cm/s
LCCAPSYS: 62 cm/s
LEFT ECA DIAS: -1 cm/s
LEFT VERTEBRAL DIAS: -14 cm/s
LICADSYS: -68 cm/s
Left CCA prox dias: 13 cm/s
Left ICA dist dias: -18 cm/s
Left ICA prox dias: -11 cm/s
Left ICA prox sys: -70 cm/s
RCCADSYS: -75 cm/s
RCCAPSYS: 70 cm/s
RIGHT ECA DIAS: -1 cm/s
RIGHT VERTEBRAL DIAS: -4 cm/s
Right CCA prox dias: 16 cm/s

## 2016-04-05 NOTE — Clinical Social Work Note (Signed)
Clinical Social Work Assessment  Patient Details  Name: Zoe Hughes MRN: [id number] Date of Birth: 27-Oct-1918  Date of referral:  04/03/16               Reason for consult:  Facility Placement                Permission sought to share information with:  Family Supports Permission granted to share information::  Yes, Verbal Permission Granted  Name::     Valleri Hendricksen   Relationship::  son   Contact Information:  [phone]  Housing/Transportation Living arrangements for the past 2 months:  [place] of Information:  Patient, Adult Children Patient Interpreter Needed:  None Criminal Activity/Legal Involvement Pertinent to Current Situation/Hospitalization:  No - Comment as needed Significant Relationships:  Adult Children Lives with:  Facility Resident Do you feel safe going back to the place where you live?  Yes Need for family participation in patient care:  Yes (Comment)  Care giving concerns:  No care giving concerns identified.   Social Worker assessment / plan:  CSW met with pt and son to address consult for pt as she is ready for discharge today and she was admitted from an ALF. CSW introduced herself and explained role of social work. CSW also explained role of social work. Pt was admitted from [place] and would like to return. Pt's daughter plans on providing transportation to facility. PT is recommending HHPT. CSW spoke with [facility] to confirm pt is able to return. Per facility, pt is in the independent living and they have inhouse PT. RNCM notified. Facility ready to accept pt. CSW is signing off as no further needs identified.   Employment status:  Retired Nurse, adult PT Recommendations:  Home with [place] / Referral to community resources:  Other (Comment Required) ([place])  Patient/Family's Response to care:  Pt and family were appreciative of CSW support.   Patient/Family's  Understanding of and Emotional Response to Diagnosis, Current Treatment, and Prognosis:  Pt and family understand that pt would benefit from HHPT at home.  Emotional Assessment Appearance:  Appears stated age Attitude/Demeanor/Rapport:  Other (Appropriate) Affect (typically observed):  Accepting, Adaptable, Pleasant Orientation:  Oriented to Self, Oriented to Place, Oriented to  Time, Oriented to Situation Alcohol / Substance use:  Never Used Psych involvement (Current and /or in the community):  No (Comment)  Discharge Needs  Concerns to be addressed:  Care Coordination Readmission within the last 30 days:  No Current discharge risk:  None Barriers to Discharge:  No Barriers Identified   Darden Dates, LCSW 04/05/2016, 11:40 AM

## 2016-04-12 ENCOUNTER — Other Ambulatory Visit: Payer: Self-pay

## 2016-04-12 NOTE — Patient Outreach (Signed)
[facility] Care Management  04/12/2016  Zoe Hughes 1919/01/19 [id number]       EMMI-Stroke RED ON EMMI ALERT Day # 6 Date: 04/11/16 Red Alert Reason: "Questions/problems with meds? Yes   Smoked or been around smoke? Yes"    Outreach attempt #1 to patient. Addressed red alerts. Patient suffering from sinus infection and having some blood tinged mucus. She does not want to take ASA as she is a "free bleeder." She had been advised to take whole ASA after discharge. Patient has decided to hold whole ASA until appt with Fri. Question regarding smoking was answered in error. No other RN CM needs and/or concerns at this time.      Plan: RN CM will notify [facility] administrative assistant of case closure.  Antionette Fairyoshanda Ulric Salzman, RN,BSN,CCM [facility] Care Management Telephonic Care Management Coordinator Direct Phone: [phone] Toll Free: [phone] Fax: [phone]

## 2016-05-29 ENCOUNTER — Ambulatory Visit: Payer: Self-pay | Admitting: Nurse Practitioner

## 2016-05-30 ENCOUNTER — Encounter: Payer: Self-pay | Admitting: Nurse Practitioner

## 2016-07-19 ENCOUNTER — Observation Stay: Payer: PPO

## 2016-07-19 ENCOUNTER — Emergency Department: Payer: PPO

## 2016-07-19 ENCOUNTER — Encounter: Payer: Self-pay | Admitting: Emergency Medicine

## 2016-07-19 ENCOUNTER — Observation Stay
Admission: EM | Admit: 2016-07-19 | Discharge: 2016-07-20 | Disposition: A | Discharge status: Skilled Nursing Facility | Payer: PPO | Attending: Internal Medicine | Admitting: Internal Medicine

## 2016-07-19 DIAGNOSIS — R079 Chest pain, unspecified: Principal | ICD-10-CM | POA: Diagnosis present

## 2016-07-19 DIAGNOSIS — I10 Essential (primary) hypertension: Secondary | ICD-10-CM | POA: Diagnosis not present

## 2016-07-19 DIAGNOSIS — I119 Hypertensive heart disease without heart failure: Secondary | ICD-10-CM | POA: Diagnosis not present

## 2016-07-19 DIAGNOSIS — R7989 Other specified abnormal findings of blood chemistry: Secondary | ICD-10-CM

## 2016-07-19 DIAGNOSIS — Z8673 Personal history of transient ischemic attack (TIA), and cerebral infarction without residual deficits: Secondary | ICD-10-CM | POA: Diagnosis not present

## 2016-07-19 DIAGNOSIS — R110 Nausea: Secondary | ICD-10-CM

## 2016-07-19 DIAGNOSIS — I4891 Unspecified atrial fibrillation: Secondary | ICD-10-CM | POA: Diagnosis not present

## 2016-07-19 DIAGNOSIS — E039 Hypothyroidism, unspecified: Secondary | ICD-10-CM | POA: Insufficient documentation

## 2016-07-19 DIAGNOSIS — I481 Persistent atrial fibrillation: Secondary | ICD-10-CM | POA: Insufficient documentation

## 2016-07-19 DIAGNOSIS — Z66 Do not resuscitate: Secondary | ICD-10-CM | POA: Insufficient documentation

## 2016-07-19 DIAGNOSIS — R778 Other specified abnormalities of plasma proteins: Secondary | ICD-10-CM | POA: Insufficient documentation

## 2016-07-19 DIAGNOSIS — I482 Chronic atrial fibrillation: Secondary | ICD-10-CM | POA: Diagnosis not present

## 2016-07-19 LAB — BASIC METABOLIC PANEL
Anion gap: 8 (ref 5–15)
BUN: 19 mg/dL (ref 6–20)
CALCIUM: 9.2 mg/dL (ref 8.9–10.3)
CO2: 27 mmol/L (ref 22–32)
Chloride: 101 mmol/L (ref 101–111)
Creatinine, Ser: 0.72 mg/dL (ref 0.44–1.00)
GFR calc Af Amer: >60 mL/min (ref >60)
GLUCOSE: 141 mg/dL — AB (ref 65–99)
Potassium: 3.8 mmol/L (ref 3.5–5.1)
Sodium: 136 mmol/L (ref 135–145)

## 2016-07-19 LAB — CBC
HEMATOCRIT: 47.8 % — AB (ref 35.0–47.0)
Hemoglobin: 16.1 g/dL — ABNORMAL HIGH (ref 12.0–16.0)
MCH: 28.8 pg (ref 26.0–34.0)
MCHC: 33.6 g/dL (ref 32.0–36.0)
MCV: 85.9 fL (ref 80.0–100.0)
Platelets: 215 10*3/uL (ref 150–440)
RBC: 5.57 MIL/uL — ABNORMAL HIGH (ref 3.80–5.20)
RDW: 14.8 % — AB (ref 11.5–14.5)
WBC: 9 10*3/uL (ref 3.6–11.0)

## 2016-07-19 LAB — TROPONIN I: Troponin I: 0.04 ng/mL (ref <0.03)

## 2016-07-19 MED ORDER — ALPRAZOLAM 0.5 MG PO TABS: 0.2500 mg | ORAL_TABLET | Freq: Two times a day (BID) | ORAL | Status: DC | PRN

## 2016-07-19 MED ORDER — LATANOPROST 0.005 % OP SOLN
1.0000 [drp] | Freq: Every day | OPHTHALMIC | Status: DC
  Administered 2016-07-20: 1 [drp] via OPHTHALMIC
  Filled 2016-07-19: qty 2.5

## 2016-07-19 MED ORDER — ACETAMINOPHEN 650 MG RE SUPP
650.0000 mg | Freq: Four times a day (QID) | RECTAL | Status: DC | PRN
  Filled 2016-07-19: qty 1

## 2016-07-19 MED ORDER — ASPIRIN 81 MG PO CHEW: 81.0000 mg | CHEWABLE_TABLET | Freq: Every day | ORAL | Status: DC

## 2016-07-19 MED ORDER — NITROGLYCERIN 2 % TD OINT
0.5000 [in_us] | TOPICAL_OINTMENT | Freq: Four times a day (QID) | TRANSDERMAL | Status: DC
  Administered 2016-07-20: 0.5 [in_us] via TOPICAL
  Filled 2016-07-19: qty 1

## 2016-07-19 MED ORDER — HYDROCODONE-ACETAMINOPHEN 5-325 MG PO TABS: 1.0000 | ORAL_TABLET | ORAL | Status: DC | PRN

## 2016-07-19 MED ORDER — PRAVASTATIN SODIUM 40 MG PO TABS: 40.0000 mg | ORAL_TABLET | Freq: Every day | ORAL | Status: DC

## 2016-07-19 MED ORDER — APIXABAN 2.5 MG PO TABS: 2.5000 mg | ORAL_TABLET | Freq: Two times a day (BID) | ORAL | Status: DC

## 2016-07-19 MED ORDER — ONDANSETRON HCL 4 MG PO TABS
4.0000 mg | ORAL_TABLET | Freq: Four times a day (QID) | ORAL | Status: DC | PRN
  Filled 2016-07-19: qty 1

## 2016-07-19 MED ORDER — SODIUM CHLORIDE 0.9% FLUSH: 3.0000 mL | Freq: Two times a day (BID) | INTRAVENOUS | Status: DC

## 2016-07-19 MED ORDER — LEVOTHYROXINE SODIUM 88 MCG PO TABS: 88.0000 ug | ORAL_TABLET | Freq: Every day | ORAL | Status: DC

## 2016-07-19 MED ORDER — ACETAMINOPHEN 325 MG PO TABS: 650.0000 mg | ORAL_TABLET | Freq: Four times a day (QID) | ORAL | Status: DC | PRN

## 2016-07-19 MED ORDER — SENNOSIDES-DOCUSATE SODIUM 8.6-50 MG PO TABS
1.0000 | ORAL_TABLET | Freq: Every evening | ORAL | Status: DC | PRN
  Filled 2016-07-19: qty 1

## 2016-07-19 MED ORDER — NITROGLYCERIN 0.4 MG SL SUBL: 0.4000 mg | SUBLINGUAL_TABLET | SUBLINGUAL | Status: DC | PRN

## 2016-07-19 MED ORDER — ALBUTEROL SULFATE (2.5 MG/3ML) 0.083% IN NEBU
2.5000 mg | INHALATION_SOLUTION | RESPIRATORY_TRACT | Status: DC | PRN
  Filled 2016-07-19: qty 3

## 2016-07-19 MED ORDER — DILTIAZEM HCL ER COATED BEADS 240 MG PO CP24: 240.0000 mg | ORAL_CAPSULE | Freq: Every day | ORAL | Status: DC

## 2016-07-19 MED ORDER — ONDANSETRON HCL 4 MG/2ML IJ SOLN: 4.0000 mg | Freq: Four times a day (QID) | INTRAMUSCULAR | Status: DC | PRN

## 2016-07-19 MED ORDER — DILTIAZEM HCL ER COATED BEADS 300 MG PO CP24: 300.0000 mg | ORAL_CAPSULE | Freq: Every day | ORAL | Status: DC

## 2016-07-19 NOTE — ED Notes (Signed)
Critical troponin 0.04 MD made aware.  

## 2016-07-19 NOTE — ED Provider Notes (Addendum)
[facility] Emergency Department Provider Note  ____________________________________________  Time seen: Approximately 11:13 PM  I have reviewed the triage vital signs and the nursing notes.   HISTORY  Chief Complaint Chest Pain    HPI Zoe Hughes is a 83 y.o. female with a history of atrial fibrillation on Eliquis, CVA, HTN, presenting with chest pain. The patient reports that over the past 3 days, she has had intermittent episodes of a "mashing" chest pain that radiates to the back and lasts for several minutes, resolves on its own. It is not associated with exertion, food, or position. It is not a tearing sensation. She has no associated nausea or vomiting, palpitations, lightheadedness, shortness of breath, or syncope. Today, at 3 PM she was sitting in her recliner when the pain started but this time it did not resolve on its own and it was associated with some nausea. She denies any recent cough or cold symptoms, fever or chills, LE swelling or calf pain.   Past Medical History:  Diagnosis Date  . A-fib (HCC)   . Hypertension   . Hypothyroid     Patient Active Problem List   Diagnosis Date Noted  . Cerebrovascular accident (CVA) due to embolism of right middle cerebral artery (HCC)   . Dysarthria   . Cerebrovascular accident (CVA) (HCC)   . Chronic atrial fibrillation (HCC)   . HLD (hyperlipidemia)   . TIA (transient ischemic attack) 03/31/2016  . Persistent atrial fibrillation (HCC)   . Essential hypertension   . Thyroid activity decreased   . Pneumonia 10/23/2015  . Hyponatremia 08/22/2015    Past Surgical History:  Procedure Laterality Date  . ABDOMINAL HYSTERECTOMY    . CHOLECYSTECTOMY      Current Outpatient Rx  . Order #: [id number] Class: No Print  . Order #: [id number] Class: Normal  . Order #: [id number] Class: Historical Med  . Order #: [id number] Class: Normal  . Order #: [id number] Class: Historical Med  . Order #:  [id number] Class: Historical Med  . Order #: [id number] Class: Historical Med  . Order #: [id number] Class: Historical Med  . Order #: [id number] Class: Historical Med  . Order #: [id number] Class: Historical Med  . Order #: [id number] Class: Historical Med  . Order #: [id number] Class: Normal  . Order #: [id number] Class: Historical Med    Allergies Clarithromycin; Aspirin; and Tetanus toxoid  History reviewed. No pertinent family history.  Social History Social History  Substance Use Topics  . Smoking status: Never Smoker  . Smokeless tobacco: Never Used  . Alcohol use No    Review of Systems Constitutional: No fever/chills.No lightheadedness or syncope. No diaphoresis. Eyes: No visual changes. ENT: No sore throat. No congestion or rhinorrhea. Cardiovascular: Positive chest pain. Denies palpitations. Respiratory: Denies shortness of breath.  No cough. Gastrointestinal: No abdominal pain.  Positive nausea, no vomiting.  No diarrhea.  No constipation. Genitourinary: Negative for dysuria. Musculoskeletal: Negative for back pain. Skin: Negative for rash. Neurological: Negative for headaches. No focal numbness, tingling or weakness.   10-point ROS otherwise negative.  ____________________________________________   PHYSICAL EXAM:  VITAL SIGNS: ED Triage Vitals  Enc Vitals Group     BP 07/19/16 2235 (!) 151/63     Pulse Rate 07/19/16 2235 67     Resp 07/19/16 2235 18     Temp 07/19/16 2235 97.7 F (36.5 C)     Temp Source 07/19/16 2235 Oral     SpO2 07/19/16 2235 95 %     Weight 07/19/16 2236 113 lb (51.3 kg)  Height 07/19/16 2236 5\' 2"  (1.575 m)     Head Circumference --      Peak Flow --      Pain Score 07/19/16 2236 0     Pain Loc --      Pain Edu? --      Excl. in GC? --     Constitutional: Alert and oriented. Well appearing for her age and in no acute distress. Answers questions appropriately. Eyes: Conjunctivae are normal.  EOMI. No scleral icterus. Head:  Atraumatic. Nose: No congestion/rhinnorhea. Mouth/Throat: Mucous membranes are moist.  Neck: No stridor.  Supple.  No JVD. Cardiovascular: Normal rate, irregular rhythm. No murmurs, rubs or gallops.  Respiratory: Normal respiratory effort.  No accessory muscle use or retractions. Lungs CTAB.  No wheezes, rales or ronchi. Gastrointestinal: Soft, nontender and mildly distended.  No guarding or rebound.  No peritoneal signs. Musculoskeletal: No LE edema. No ttp in the calves or palpable cords.  Negative Homan's sign. Neurologic:  A&Ox3.  Speech is clear.  Face and smile are symmetric.  EOMI.  Moves all extremities well. Skin:  Skin is warm, dry and intact. No rash noted. Psychiatric: Mood and affect are normal. Speech and behavior are normal.  Normal judgement.  ____________________________________________   LABS (all labs ordered are listed, but only abnormal results are displayed)  Labs Reviewed  CBC - Abnormal; Notable for the following:       Result Value   RBC 5.57 (*)    Hemoglobin 16.1 (*)    HCT 47.8 (*)    RDW 14.8 (*)    All other components within normal limits  BASIC METABOLIC PANEL  TROPONIN I   ____________________________________________  EKG  ED ECG REPORT I, Rockne Menghini, the attending physician, personally viewed and interpreted this ECG.   Date: 07/19/2016  EKG Time: 2235  Rate: 76  Rhythm: atrial fibrillation  Axis: leftward  Intervals:none  ST&T Change: No ST elevation.  ____________________________________________  RADIOLOGY  Dg Chest 2 View  Result Date: 07/19/2016 CLINICAL DATA:  Chest pain tonight. EXAM: CHEST  2 VIEW COMPARISON:  10/25/2015 FINDINGS: Cardiomegaly is stable. Mediastinal contours are unchanged. There is atherosclerosis of the thoracic aorta. Improved bibasilar aeration with resolved pleural effusions and bibasilar opacities. No new focal airspace disease. No pneumothorax. Degenerative change in the spine and both  shoulders. IMPRESSION: 1. Stable cardiomegaly without congestive failure or acute abnormality. 2. Thoracic aortic atherosclerosis. Electronically Signed   By: Rubye Oaks M.D.   On: 07/19/2016 23:15    ____________________________________________   PROCEDURES  Procedure(s) performed: None  Procedures  Critical Care performed: No ____________________________________________   INITIAL IMPRESSION / ASSESSMENT AND PLAN / ED COURSE  Pertinent labs & imaging results that were available during my care of the patient were reviewed by me and considered in my medical decision making (see chart for details).  83 y.o. female with a history of atrial fibrillation presenting with chest pain that has increased in severity and length over the last couple of days. The patient has reassuring vital signs, she is in A. fib on her EKG without any obvious ischemic changes. It is possible that she has been having intermittent arrhythmia causing her chest pain, but I am also concerned about ACS or MI given her multiple cardiac risk factors. She is on Eliquis so not a candidate for aspirin. Other potential causes include GI causes such as GERD, peptic ulcer, consider an underlying pulmonary cause although she is not having any shortness of breath. PE  is very unlikely. Pain does radiate to the back, but it is not a tearing sensation and aortic dissection is much less likely as well. I'll plan to get a troponin, and anticipated admission to the hospital for cardiac rule out.  ----------------------------------------- 11:38 PM on 07/19/2016 -----------------------------------------  The patient denies being on Eliquis, but states she takes a full aspirin a day and took one today. ____________________________________________  FINAL CLINICAL IMPRESSION(S) / ED DIAGNOSES  Final diagnoses:  Chest pain, unspecified type  Nausea without vomiting    Clinical Course       NEW MEDICATIONS STARTED DURING  THIS VISIT:  New Prescriptions   No medications on file      Rockne Menghini, MD 07/19/16 2319    Rockne Menghini, MD 07/19/16 2338

## 2016-07-19 NOTE — ED Triage Notes (Signed)
Pt coming from [facility] with complaints of chestpain. Had an episode of chestpain at 5:30 tonight and then again about an hour ago. No meds were given.

## 2016-07-19 NOTE — H&P (Signed)
[facility] [place] [facility]   PATIENT NAME: Zoe Hughes    MR#:  [id number]  DATE OF BIRTH:  01/03/1919  DATE OF ADMISSION:  07/19/2016  PRIMARY CARE PHYSICIAN: Mick SellFITZGERALD, DAVID P, MD   REQUESTING/REFERRING PHYSICIAN: dr Sharma Covertnorman  CHIEF COMPLAINT:   Chest pain HISTORY OF PRESENT ILLNESS:  Zoe Hughes  is a 83 y.o. female with a known history of PAF on aspirin who presents with chest pain. Patient states that over the past 2-3 days she has had intermittent chest pain which radiates to her back. She denies shortness of breath, nausea or diaphoresis associated chest pain. These last anywhere between 5-60 seconds. She does not have chest pain currently. She denies pleuritic chest pain or tearing sensation. There is no relieving or aggravating factors. She denies recent travel history, lower extremity edema, cough or shortness of breath.  PAST MEDICAL HISTORY:   Past Medical History:  Diagnosis Date  . A-fib (HCC)   . Hypertension   . Hypothyroid     PAST SURGICAL HISTORY:   Past Surgical History:  Procedure Laterality Date  . ABDOMINAL HYSTERECTOMY    . CHOLECYSTECTOMY      SOCIAL HISTORY:   Social History  Substance Use Topics  . Smoking status: Never Smoker  . Smokeless tobacco: Never Used  . Alcohol use No    FAMILY HISTORY:   Alzheimer's dementia and lung cancer DRUG ALLERGIES:   Allergies  Allergen Reactions  . Clarithromycin Diarrhea  . Aspirin Other (See Comments)    Other reaction(s): Blood Disorder-thin blood too much when patient takes too much or high doses of aspirin.  . Tetanus Toxoid Rash    REVIEW OF SYSTEMS:   Review of Systems  Constitutional: Negative.  Negative for chills, fever and malaise/fatigue.  HENT: Negative.  Negative for ear discharge, ear pain, hearing loss, nosebleeds and sore throat.   Eyes: Negative.  Negative for blurred vision and pain.  Respiratory: Negative.  Negative for cough, hemoptysis,  shortness of breath and wheezing.   Cardiovascular: Positive for chest pain. Negative for palpitations and leg swelling.  Gastrointestinal: Negative.  Negative for abdominal pain, blood in stool, diarrhea, nausea and vomiting.  Genitourinary: Negative.  Negative for dysuria.  Musculoskeletal: Negative.  Negative for back pain.  Skin: Negative.   Neurological: Negative for dizziness, tremors, speech change, focal weakness, seizures and headaches.  Endo/Heme/Allergies: Negative.  Does not bruise/bleed easily.  Psychiatric/Behavioral: Negative.  Negative for depression, hallucinations and suicidal ideas.    MEDICATIONS AT HOME:   Prior to Admission medications   Medication Sig Start Date End Date Taking? Authorizing Provider  acetaminophen (TYLENOL) 500 MG tablet Take 2 tablets (1,000 mg total) by mouth at bedtime as needed for mild pain or moderate pain (sleep). 04/02/16 04/02/17  Palma HolterKanishka G Gunadasa, MD    08/25/15   Enedina FinnerSona Patel, MD                Diltiazem 240 mg daily                    Historical Provider, MD  latanoprost (XALATAN) 0.005 % ophthalmic solution Place 1 drop into the right eye at bedtime.  08/13/15   Historical Provider, MD  levothyroxine (SYNTHROID, LEVOTHROID) 88 MCG tablet Take 50 mcg by mouth daily before breakfast. 10/12/15   Historical Provider, MD       Historical Provider, MD       Historical Provider, MD  Historical Provider, MD      VITAL SIGNS:  Blood pressure (!) 155/70, pulse 76, temperature 97.7 F (36.5 C), temperature source Oral, resp. rate 18, height 5\' 2"  (1.575 m), weight 51.3 kg (113 lb), SpO2 96 %.  PHYSICAL EXAMINATION:   Physical Exam  Constitutional: She is oriented to person, place, and time and well-developed, well-nourished, and in no distress. No distress.  Appears younger than stated age  HENT:  Head: Normocephalic.  Eyes: No scleral icterus.  Neck: Normal range of motion. Neck supple. No JVD present. No tracheal deviation  present.  Cardiovascular: Normal rate, regular rhythm and normal heart sounds.  Exam reveals no gallop and no friction rub.   No murmur heard. Pulmonary/Chest: Effort normal and breath sounds normal. No respiratory distress. She has no wheezes. She has no rales. She exhibits no tenderness.  Abdominal: Soft. Bowel sounds are normal. She exhibits no distension and no mass. There is no tenderness. There is no rebound and no guarding.  Musculoskeletal: Normal range of motion. She exhibits no edema.  Neurological: She is alert and oriented to person, place, and time.  Skin: Skin is warm. No rash noted. No erythema.  Psychiatric: Affect and judgment normal.      LABORATORY PANEL:   CBC  Recent Labs Lab 07/19/16 2238  WBC 9.0  HGB 16.1*  HCT 47.8*  PLT 215   ------------------------------------------------------------------------------------------------------------------  Chemistries   Recent Labs Lab 07/19/16 2238  NA 136  K 3.8  CL 101  CO2 27  GLUCOSE 141*  BUN 19  CREATININE 0.72  CALCIUM 9.2   ------------------------------------------------------------------------------------------------------------------  Cardiac Enzymes  Recent Labs Lab 07/19/16 2238  TROPONINI 0.04*   ------------------------------------------------------------------------------------------------------------------  RADIOLOGY:  Dg Chest 2 View  Result Date: 07/19/2016 CLINICAL DATA:  Chest pain tonight. EXAM: CHEST  2 VIEW COMPARISON:  10/25/2015 FINDINGS: Cardiomegaly is stable. Mediastinal contours are unchanged. There is atherosclerosis of the thoracic aorta. Improved bibasilar aeration with resolved pleural effusions and bibasilar opacities. No new focal airspace disease. No pneumothorax. Degenerative change in the spine and both shoulders. IMPRESSION: 1. Stable cardiomegaly without congestive failure or acute abnormality. 2. Thoracic aortic atherosclerosis. Electronically Signed   By:  Rubye Oaks M.D.   On: 07/19/2016 23:15    EKG:   Atrial fibrillation no ST elevation or depression  IMPRESSION AND PLAN:   83 year old female who presents with chest pain over several days and minimally elevated troponin  1. Chest pain with minimally elevated troponin:CT chest to evaluate for PE/dissection Follow troponins Telemetry monitoring Cardiology consultation  2. Atrial fibrillation on aspirin and diltiazem Continue telemetry  3. Hypothyroid: Continue Synthroid  4. Essential hypertension on diltiazem  All the records are reviewed and case discussed with ED provider. Management plans discussed with the patient and she is in agreement  CODE STATUS: DNR  TOTAL TIME TAKING CARE OF THIS PATIENT: 45 minutes.    Daje Stark M.D on 07/19/2016 at 11:48 PM  Between 7am to 6pm - Pager - [phone]  After 6pm go to [email or web address] - Social research officer, government  [facility]  Office  [phone]  CC: Primary care physician; Mick Sell, MD

## 2016-07-20 ENCOUNTER — Encounter: Payer: Self-pay | Admitting: Radiology

## 2016-07-20 DIAGNOSIS — R079 Chest pain, unspecified: Secondary | ICD-10-CM | POA: Diagnosis not present

## 2016-07-20 DIAGNOSIS — I700 Atherosclerosis of aorta: Secondary | ICD-10-CM | POA: Diagnosis not present

## 2016-07-20 DIAGNOSIS — Z9989 Dependence on other enabling machines and devices: Secondary | ICD-10-CM | POA: Diagnosis not present

## 2016-07-20 LAB — LIPID PANEL
CHOLESTEROL: 173 mg/dL (ref 0–200)
HDL: 46 mg/dL (ref >40)
LDL Cholesterol: 113 mg/dL — ABNORMAL HIGH (ref 0–99)
Total CHOL/HDL Ratio: 3.8 RATIO
Triglycerides: 69 mg/dL (ref <150)
VLDL: 14 mg/dL (ref 0–40)

## 2016-07-20 LAB — TROPONIN I: Troponin I: 0.03 ng/mL (ref <0.03)

## 2016-07-20 MED ORDER — ASPIRIN 81 MG PO CHEW: 81.0000 mg | CHEWABLE_TABLET | Freq: Every day | ORAL | 0 refills | Status: AC

## 2016-07-20 MED ORDER — IOPAMIDOL (ISOVUE-370) INJECTION 76%
100.0000 mL | Freq: Once | INTRAVENOUS | Status: AC | PRN
  Administered 2016-07-20: 100 mL via INTRAVENOUS

## 2016-07-20 MED ORDER — ISOSORBIDE MONONITRATE ER 30 MG PO TB24: 30.0000 mg | ORAL_TABLET | Freq: Every day | ORAL | 0 refills | Status: AC

## 2016-07-20 NOTE — ED Notes (Signed)
PT. Helped to toilet in room, no complications.

## 2016-07-20 NOTE — Progress Notes (Signed)
Family Meeting Note  Advance Directive:yes  Today a meeting took place with the Patient.  The following clinical team members were present during this meeting:MD  The following were discussed:Patient's diagnosis:chest pain, Patient's progosis: Unable to determine and Goals for treatment: DNR  Additional follow-up to be provided: considering palliative care at discharge  Time spent during discussion:16 minutes  Randee Upchurch, MD

## 2016-07-20 NOTE — Discharge Summary (Signed)
[facility] [place] [facility]   PATIENT NAME: Zoe Hughes    MR#:  [id number]  DATE OF BIRTH:  Apr 12, 1919  DATE OF ADMISSION:  07/19/2016 ADMITTING PHYSICIAN: No admitting provider for patient encounter.  DATE OF DISCHARGE: 07/20/2016  PRIMARY CARE PHYSICIAN: Mick Sell, MD    ADMISSION DIAGNOSIS:  chest pains  DISCHARGE DIAGNOSIS:  Active Problems:   Chest pain   SECONDARY DIAGNOSIS:   Past Medical History:  Diagnosis Date  . A-fib (HCC)   . Hypertension   . Hypothyroid     HOSPITAL COURSE:   83 year old female who presents with chest pain over several days and minimally elevated troponin  1. Chest pain with minimally elevated troponin:CT chest to evaluate for PE/dissection was negative.  She had no indication for ACS. She had no further chest pain while she was in the hospital. She will continue on diltiazem however I did start isosorbide as per my conversation with Dr Juliann Pares who did not necessarily need to consult on patient during this hospital stay.   2. Atrial fibrillation on aspirin and diltiazem She will follow-up with Dr. Lady Gary  3. Hypothyroid: Continue Synthroid  4. Essential hypertension on diltiazem   DISCHARGE CONDITIONS AND DIET:   Stable cardiac diet  CONSULTS OBTAINED:    DRUG ALLERGIES:   Allergies  Allergen Reactions  . Clarithromycin Diarrhea  . Aspirin Other (See Comments)    Other reaction(s): Blood Disorder-thin blood too much when patient takes too much or high doses of aspirin.  . Tetanus Toxoid Rash    DISCHARGE MEDICATIONS:   Current Discharge Medication List    START taking these medications   Details  aspirin 81 MG chewable tablet Chew 1 tablet (81 mg total) by mouth daily. Qty: 120 tablet, Refills: 0    isosorbide mononitrate (IMDUR) 30 MG 24 hr tablet Take 1 tablet (30 mg total) by mouth daily. Qty: 30 tablet, Refills: 0      CONTINUE these medications which have NOT CHANGED    Details  acetaminophen (TYLENOL) 325 MG tablet Take 325 mg by mouth every 6 (six) hours as needed.    latanoprost (XALATAN) 0.005 % ophthalmic solution Place 1 drop into the right eye at bedtime.     levothyroxine (SYNTHROID, LEVOTHROID) 50 MCG tablet Take 50 mcg by mouth daily.     ALPRAZolam (XANAX) 0.25 MG tablet Take 0.25 mg by mouth 2 (two) times daily as needed for anxiety.    diltiazem (CARDIZEM LA) 240 MG 24 hr tablet Take 240 mg by mouth daily.     loratadine (CLARITIN) 10 MG tablet Take 10 mg by mouth daily.    pantoprazole (PROTONIX) 40 MG tablet Take 40 mg by mouth daily.    traZODone (DESYREL) 50 MG tablet Take 50 mg by mouth at bedtime as needed for sleep.      STOP taking these medications     aspirin 325 MG tablet      clotrimazole-betamethasone (LOTRISONE) cream      apixaban (ELIQUIS) 2.5 MG TABS tablet      fluticasone (FLONASE) 50 MCG/ACT nasal spray      furosemide (LASIX) 20 MG tablet      pravastatin (PRAVACHOL) 40 MG tablet      albuterol (PROVENTIL) (2.5 MG/3ML) 0.083% nebulizer solution               Today   CHIEF COMPLAINT:  Doing well no further chest pain or shortness of breath    VITAL  SIGNS:  Blood pressure (!) 153/75, pulse 88, temperature 97.7 F (36.5 C), temperature source Oral, resp. rate 16, height 5\' 2"  (1.575 m), weight 51.3 kg (113 lb), SpO2 96 %.   REVIEW OF SYSTEMS:  Review of Systems  Constitutional: Negative.  Negative for chills, fever and malaise/fatigue.  HENT: Negative.  Negative for ear discharge, ear pain, hearing loss, nosebleeds and sore throat.   Eyes: Negative.  Negative for blurred vision and pain.  Respiratory: Negative.  Negative for cough, hemoptysis, shortness of breath and wheezing.   Cardiovascular: Negative.  Negative for chest pain, palpitations and leg swelling.  Gastrointestinal: Negative.  Negative for abdominal pain, blood in stool, diarrhea, nausea and vomiting.  Genitourinary:  Negative.  Negative for dysuria.  Musculoskeletal: Negative.  Negative for back pain.  Skin: Negative.   Neurological: Negative for dizziness, tremors, speech change, focal weakness, seizures and headaches.  Endo/Heme/Allergies: Negative.  Does not bruise/bleed easily.  Psychiatric/Behavioral: Negative.  Negative for depression, hallucinations and suicidal ideas.     PHYSICAL EXAMINATION:  GENERAL:  83 y.o.-year-old patient lying in the bed with no acute distress.  NECK:  Supple, no jugular venous distention. No thyroid enlargement, no tenderness.  LUNGS: Normal breath sounds bilaterally, no wheezing, rales,rhonchi  No use of accessory muscles of respiration.  CARDIOVASCULAR: S1, S2 normal. No murmurs, rubs, or gallops.  ABDOMEN: Soft, non-tender, non-distended. Bowel sounds present. No organomegaly or mass.  EXTREMITIES: No pedal edema, cyanosis, or clubbing.  PSYCHIATRIC: The patient is alert and oriented x 3.  SKIN: No obvious rash, lesion, or ulcer.   DATA REVIEW:   CBC  Recent Labs Lab 07/19/16 2238  WBC 9.0  HGB 16.1*  HCT 47.8*  PLT 215    Chemistries   Recent Labs Lab 07/19/16 2238  NA 136  K 3.8  CL 101  CO2 27  GLUCOSE 141*  BUN 19  CREATININE 0.72  CALCIUM 9.2    Cardiac Enzymes  Recent Labs Lab 07/19/16 2238 07/20/16 0116  TROPONINI 0.04* 0.03*    Microbiology Results  @[email or web address] @  RADIOLOGY:  Dg Chest 2 View  Result Date: 07/19/2016 CLINICAL DATA:  Chest pain tonight. EXAM: CHEST  2 VIEW COMPARISON:  10/25/2015 FINDINGS: Cardiomegaly is stable. Mediastinal contours are unchanged. There is atherosclerosis of the thoracic aorta. Improved bibasilar aeration with resolved pleural effusions and bibasilar opacities. No new focal airspace disease. No pneumothorax. Degenerative change in the spine and both shoulders. IMPRESSION: 1. Stable cardiomegaly without congestive failure or acute abnormality. 2. Thoracic aortic atherosclerosis.  Electronically Signed   By: Rubye Oaks M.D.   On: 07/19/2016 23:15   Ct Angio Chest/abd/pel For Dissection W And/or W/wo  Result Date: 07/20/2016 CLINICAL DATA:  83 year old with chest pain radiating to the back. EXAM: CT ANGIOGRAPHY CHEST, ABDOMEN AND PELVIS TECHNIQUE: Multidetector CT imaging through the chest, abdomen and pelvis was performed using the standard protocol during bolus administration of intravenous contrast. Multiplanar reconstructed images and MIPs were obtained and reviewed to evaluate the vascular anatomy. CONTRAST:  100 cc Isovue 370 IV COMPARISON:  Chest radiograph earlier this day FINDINGS: CTA CHEST FINDINGS Cardiovascular: No aortic dissection or aneurysm. Moderate diffuse atherosclerosis. Conventional branching pattern from the aortic arch. No evidence of aortic hematoma or acute aortic syndrome. No filling defects in the pulmonary arteries to suggest pulmonary embolus. Multi chamber cardiomegaly. There are scattered coronary artery calcifications. Contrast refluxes into the hepatic veins and IVC. Mediastinum/Nodes: No mediastinal or hilar adenopathy. The esophagus is decompressed. Trachea and mainstem bronchi  are patent. Lungs/Pleura: Mild motion artifact. No focal airspace consolidation. There is trace smooth septal thickening. No pulmonary mass or suspicious nodule. CT 5 granuloma in the fall scarring. No pleural fluid. Musculoskeletal: Irregular disc space loss at T12-L1 with mild loss of height of inferior T12 vertebral body, as suggests sequela of remote prior injury or infection. There are no acute or suspicious osseous abnormalities. Review of the MIP images confirms the above findings. CTA ABDOMEN AND PELVIS FINDINGS VASCULAR Aorta: No dissection or aneurysm. Moderate to advanced diffuse atherosclerosis. No acute aortic abnormality. Celiac: Atherosclerotic calcification at the origin without luminal narrowing. SMA: Severe stenosis suspected at the origin secondary to  atherosclerotic plaque but no poststenotic dilatation. Distal branch vessels are patent. Renals: Origin atherosclerotic calcification with mild stenosis on the left. No significant stenosis of the right. No accessory renal arteries. IMA: Patent without evidence of aneurysm, dissection, vasculitis or significant stenosis. Inflow: Patent without evidence of aneurysm, dissection, vasculitis or significant stenosis. Moderate atherosclerosis. Veins: Contrast refluxing into the hepatic veins and IVC. Otherwise not well evaluated on this arterial phase study. Review of the MIP images confirms the above findings. NON-VASCULAR Hepatobiliary: No focal hepatic lesion. Postcholecystectomy with clips in the gallbladder fossa. There is biliary dilatation with common bile duct measuring 19 mm 19-20 mm proximally and at the porta hepatis. Probable normal tapering distally, however motion obscures evaluation. Pancreas: Motion obscures evaluation. Two cysts are seen about the distal pancreatic body measuring 13 and 13 mm. No ductal dilatation or inflammation. Spleen: Normal in size without focal abnormality. Adrenals/Urinary Tract: Mild left adrenal thickening without discrete nodule. Right adrenal gland is normal. No hydronephrosis. Motion artifact obscures evaluation. No perinephric edema. Urinary bladder is physiologically distended. Stomach/Bowel: Suboptimally assessed. Small hiatal hernia. Sigmoid colonic diverticulosis without evidence of acute inflammation. No bowel obstruction. Lymphatic: No adenopathy. Reproductive: Status post hysterectomy. No adnexal masses. Other: No free air or free fluid. Musculoskeletal: Multilevel degenerative change throughout spine. There are no acute or suspicious osseous abnormalities. Review of the MIP images confirms the above findings. IMPRESSION: Vascular: 1. No aortic dissection or acute aortic abnormality. 2. Diffuse thoracoabdominal atherosclerosis. Atherosclerotic plaque at the origin of  the branch vessels of the thoracoabdominal aorta, with stenosis at the origin of the SMA. Nonvascular: 1. Contrast refluxing into the IVC and hepatic veins is consistent with right heart failure. Minimal pulmonary edema. 2. Two cysts about the distal pancreatic body measuring 13 mm each. For patient of this age, recommend follow up pre and post contrast MRI/MRCP or pancreatic protocol CT in 2 years. This recommendation follows ACR consensus guidelines: Management of Incidental Pancreatic Cysts: A White Paper of the ACR Incidental Findings Committee. J Am Coll Radiol 2017;14:911-923. 3. Biliary dilatation postcholecystectomy, common bile duct measures 2 cm. No prior exams available to evaluate for chronicity, but likely chronic. Recommend correlation with LFTs. 4. Sigmoid colonic diverticulosis without diverticulitis. Electronically Signed   By: Rubye Oaks M.D.   On: 07/20/2016 00:42      Management plans discussed with the patient and she is in agreement. Stable for discharge   Patient should follow up with pcp  CODE STATUS:     Code Status Orders        Start     Ordered   07/19/16 2348  Do not attempt resuscitation (DNR)  Continuous    Question Answer Comment  In the event of cardiac or respiratory ARREST Do not call a "code blue"   In the event of cardiac or respiratory  ARREST Do not perform Intubation, CPR, defibrillation or ACLS   In the event of cardiac or respiratory ARREST Use medication by any route, position, wound care, and other measures to relive pain and suffering. May use oxygen, suction and manual treatment of airway obstruction as needed for comfort.      07/19/16 2347    Code Status History    Date Active Date Inactive Code Status Order ID Comments User Context   07/19/2016 11:39 PM 07/19/2016 11:47 PM DNR [id number]  Adrian SaranSital Kamon Fahr, MD ED   03/31/2016  9:08 PM 04/03/2016  6:53 PM DNR [id number]  Arvilla Marketatherine Lauren Wallace, DO Inpatient   10/23/2015  7:38 PM 10/27/2015  5:20  PM Full Code [id number]  Auburn BilberryShreyang Patel, MD ED   08/22/2015  4:50 PM 08/25/2015  4:41 PM Full Code [id number]  Enedina FinnerSona Patel, MD Inpatient    Advance Directive Documentation   Flowsheet Row Most Recent Value  Type of Advance Directive  Out of facility DNR (pink MOST or yellow form)  Pre-existing out of facility DNR order (yellow form or pink MOST form)  No data  "MOST" Form in Place?  No data      TOTAL TIME TAKING CARE OF THIS PATIENT: 36 minutes.    Note: This dictation was prepared with Dragon dictation along with smaller phrase technology. Any transcriptional errors that result from this process are unintentional.  Tajay Muzzy M.D on 07/20/2016 at 7:31 AM  Between 7am to 6pm - Pager - [phone] After 6pm go to [email or web address] - Social research officer, governmentpassword [email or web address] [email or web address]  [facility]  Office  [phone]  CC: Primary care physician; Mick SellFITZGERALD, DAVID P, MD

## 2016-07-20 NOTE — ED Notes (Signed)
Assisted pt up to bathroom and back into bed. Pt ambulated with steady gait. NAD noted at this time.

## 2016-07-20 NOTE — ED Notes (Signed)
Report to laura, rn.  

## 2016-09-18 DIAGNOSIS — E079 Disorder of thyroid, unspecified: Secondary | ICD-10-CM | POA: Diagnosis not present

## 2016-09-18 DIAGNOSIS — I519 Heart disease, unspecified: Secondary | ICD-10-CM | POA: Diagnosis not present

## 2016-09-18 DIAGNOSIS — I10 Essential (primary) hypertension: Secondary | ICD-10-CM | POA: Diagnosis not present

## 2016-09-18 DIAGNOSIS — E871 Hypo-osmolality and hyponatremia: Secondary | ICD-10-CM | POA: Diagnosis not present

## 2016-09-18 DIAGNOSIS — I4891 Unspecified atrial fibrillation: Secondary | ICD-10-CM | POA: Diagnosis not present

## 2016-09-26 DIAGNOSIS — I6310 Cerebral infarction due to embolism of unspecified precerebral artery: Secondary | ICD-10-CM | POA: Diagnosis not present

## 2016-09-26 DIAGNOSIS — E871 Hypo-osmolality and hyponatremia: Secondary | ICD-10-CM | POA: Diagnosis not present

## 2016-09-26 DIAGNOSIS — I10 Essential (primary) hypertension: Secondary | ICD-10-CM | POA: Diagnosis not present

## 2016-09-26 DIAGNOSIS — E079 Disorder of thyroid, unspecified: Secondary | ICD-10-CM | POA: Diagnosis not present

## 2016-09-26 DIAGNOSIS — I4891 Unspecified atrial fibrillation: Secondary | ICD-10-CM | POA: Diagnosis not present

## 2016-10-20 DIAGNOSIS — I6310 Cerebral infarction due to embolism of unspecified precerebral artery: Secondary | ICD-10-CM | POA: Diagnosis not present

## 2016-10-20 DIAGNOSIS — I10 Essential (primary) hypertension: Secondary | ICD-10-CM | POA: Diagnosis not present

## 2016-10-20 DIAGNOSIS — I4891 Unspecified atrial fibrillation: Secondary | ICD-10-CM | POA: Diagnosis not present

## 2016-10-26 ENCOUNTER — Emergency Department (HOSPITAL_COMMUNITY): Payer: PPO

## 2016-10-26 ENCOUNTER — Inpatient Hospital Stay (HOSPITAL_COMMUNITY)
Admission: EM | Admit: 2016-10-26 | Discharge: 2016-10-31 | DRG: 065 | Disposition: E | Payer: PPO | Attending: Internal Medicine | Admitting: Internal Medicine

## 2016-10-26 ENCOUNTER — Encounter (HOSPITAL_COMMUNITY): Payer: Self-pay | Admitting: Emergency Medicine

## 2016-10-26 DIAGNOSIS — E039 Hypothyroidism, unspecified: Secondary | ICD-10-CM | POA: Diagnosis not present

## 2016-10-26 DIAGNOSIS — R29810 Facial weakness: Secondary | ICD-10-CM | POA: Diagnosis present

## 2016-10-26 DIAGNOSIS — K219 Gastro-esophageal reflux disease without esophagitis: Secondary | ICD-10-CM | POA: Diagnosis not present

## 2016-10-26 DIAGNOSIS — I6601 Occlusion and stenosis of right middle cerebral artery: Secondary | ICD-10-CM | POA: Diagnosis not present

## 2016-10-26 DIAGNOSIS — I482 Chronic atrial fibrillation, unspecified: Secondary | ICD-10-CM | POA: Diagnosis present

## 2016-10-26 DIAGNOSIS — Z886 Allergy status to analgesic agent status: Secondary | ICD-10-CM | POA: Diagnosis not present

## 2016-10-26 DIAGNOSIS — Z7189 Other specified counseling: Secondary | ICD-10-CM

## 2016-10-26 DIAGNOSIS — Z66 Do not resuscitate: Secondary | ICD-10-CM | POA: Diagnosis not present

## 2016-10-26 DIAGNOSIS — Z881 Allergy status to other antibiotic agents status: Secondary | ICD-10-CM | POA: Diagnosis not present

## 2016-10-26 DIAGNOSIS — Z9049 Acquired absence of other specified parts of digestive tract: Secondary | ICD-10-CM

## 2016-10-26 DIAGNOSIS — Z515 Encounter for palliative care: Secondary | ICD-10-CM

## 2016-10-26 DIAGNOSIS — Z8673 Personal history of transient ischemic attack (TIA), and cerebral infarction without residual deficits: Secondary | ICD-10-CM | POA: Diagnosis not present

## 2016-10-26 DIAGNOSIS — Z79899 Other long term (current) drug therapy: Secondary | ICD-10-CM

## 2016-10-26 DIAGNOSIS — Z887 Allergy status to serum and vaccine status: Secondary | ICD-10-CM | POA: Diagnosis not present

## 2016-10-26 DIAGNOSIS — E785 Hyperlipidemia, unspecified: Secondary | ICD-10-CM | POA: Diagnosis present

## 2016-10-26 DIAGNOSIS — I10 Essential (primary) hypertension: Secondary | ICD-10-CM | POA: Diagnosis present

## 2016-10-26 DIAGNOSIS — I63511 Cerebral infarction due to unspecified occlusion or stenosis of right middle cerebral artery: Secondary | ICD-10-CM | POA: Diagnosis not present

## 2016-10-26 DIAGNOSIS — R531 Weakness: Secondary | ICD-10-CM | POA: Diagnosis not present

## 2016-10-26 DIAGNOSIS — I63419 Cerebral infarction due to embolism of unspecified middle cerebral artery: Secondary | ICD-10-CM

## 2016-10-26 DIAGNOSIS — R471 Dysarthria and anarthria: Secondary | ICD-10-CM | POA: Diagnosis not present

## 2016-10-26 DIAGNOSIS — Z7982 Long term (current) use of aspirin: Secondary | ICD-10-CM | POA: Diagnosis not present

## 2016-10-26 DIAGNOSIS — F419 Anxiety disorder, unspecified: Secondary | ICD-10-CM | POA: Diagnosis present

## 2016-10-26 DIAGNOSIS — Z7901 Long term (current) use of anticoagulants: Secondary | ICD-10-CM | POA: Diagnosis not present

## 2016-10-26 DIAGNOSIS — I639 Cerebral infarction, unspecified: Secondary | ICD-10-CM | POA: Diagnosis present

## 2016-10-26 DIAGNOSIS — G8104 Flaccid hemiplegia affecting left nondominant side: Secondary | ICD-10-CM | POA: Diagnosis present

## 2016-10-26 DIAGNOSIS — I679 Cerebrovascular disease, unspecified: Secondary | ICD-10-CM | POA: Diagnosis not present

## 2016-10-26 LAB — COMPREHENSIVE METABOLIC PANEL
ALBUMIN: 4.4 g/dL (ref 3.5–5.0)
ALT: 20 U/L (ref 14–54)
AST: 25 U/L (ref 15–41)
Alkaline Phosphatase: 123 U/L (ref 38–126)
Anion gap: 11 (ref 5–15)
BUN: 20 mg/dL (ref 6–20)
CHLORIDE: 105 mmol/L (ref 101–111)
CO2: 24 mmol/L (ref 22–32)
CREATININE: 1.03 mg/dL — AB (ref 0.44–1.00)
Calcium: 9.5 mg/dL (ref 8.9–10.3)
GFR calc non Af Amer: 44 mL/min — ABNORMAL LOW (ref >60)
GFR, EST AFRICAN AMERICAN: 51 mL/min — AB (ref >60)
GLUCOSE: 141 mg/dL — AB (ref 65–99)
Potassium: 3.9 mmol/L (ref 3.5–5.1)
SODIUM: 140 mmol/L (ref 135–145)
Total Bilirubin: 0.9 mg/dL (ref 0.3–1.2)
Total Protein: 6.8 g/dL (ref 6.5–8.1)

## 2016-10-26 LAB — APTT: APTT: 28 s (ref 24–36)

## 2016-10-26 LAB — I-STAT CHEM 8, ED
BUN: 26 mg/dL — AB (ref 6–20)
CHLORIDE: 105 mmol/L (ref 101–111)
Calcium, Ion: 1.09 mmol/L — ABNORMAL LOW (ref 1.15–1.40)
Creatinine, Ser: 1 mg/dL (ref 0.44–1.00)
Glucose, Bld: 145 mg/dL — ABNORMAL HIGH (ref 65–99)
HEMATOCRIT: 45 % (ref 36.0–46.0)
Hemoglobin: 15.3 g/dL — ABNORMAL HIGH (ref 12.0–15.0)
POTASSIUM: 4.1 mmol/L (ref 3.5–5.1)
Sodium: 140 mmol/L (ref 135–145)
TCO2: 27 mmol/L (ref 0–100)

## 2016-10-26 LAB — PROTIME-INR
INR: 1.13
PROTHROMBIN TIME: 14.6 s (ref 11.4–15.2)

## 2016-10-26 LAB — DIFFERENTIAL
BASOS ABS: 0 10*3/uL (ref 0.0–0.1)
BASOS PCT: 0 %
Eosinophils Absolute: 0.2 10*3/uL (ref 0.0–0.7)
Eosinophils Relative: 2 %
Lymphocytes Relative: 36 %
Lymphs Abs: 3.8 10*3/uL (ref 0.7–4.0)
MONO ABS: 0.7 10*3/uL (ref 0.1–1.0)
Monocytes Relative: 7 %
NEUTROS ABS: 5.7 10*3/uL (ref 1.7–7.7)
NEUTROS PCT: 55 %

## 2016-10-26 LAB — ETHANOL

## 2016-10-26 LAB — CBC
HCT: 44.8 % (ref 36.0–46.0)
Hemoglobin: 14.8 g/dL (ref 12.0–15.0)
MCH: 28.9 pg (ref 26.0–34.0)
MCHC: 33 g/dL (ref 30.0–36.0)
MCV: 87.5 fL (ref 78.0–100.0)
PLATELETS: 223 10*3/uL (ref 150–400)
RBC: 5.12 MIL/uL — AB (ref 3.87–5.11)
RDW: 13.6 % (ref 11.5–15.5)
WBC: 10.4 10*3/uL (ref 4.0–10.5)

## 2016-10-26 LAB — CBG MONITORING, ED
Glucose-Capillary: 139 mg/dL — ABNORMAL HIGH (ref 65–99)
Glucose-Capillary: 145 mg/dL — ABNORMAL HIGH (ref 65–99)

## 2016-10-26 LAB — I-STAT TROPONIN, ED: Troponin i, poc: 0.03 ng/mL (ref 0.00–0.08)

## 2016-10-26 MED ORDER — BIOTENE DRY MOUTH MT LIQD: 15.0000 mL | OROMUCOSAL | Status: DC | PRN

## 2016-10-26 MED ORDER — SODIUM CHLORIDE 0.9 % IV SOLN
2.0000 mg/h | INTRAVENOUS | Status: DC
  Administered 2016-10-26: 1 mg/h via INTRAVENOUS
  Filled 2016-10-26: qty 10

## 2016-10-26 MED ORDER — ONDANSETRON HCL 4 MG/2ML IJ SOLN: 4.0000 mg | Freq: Four times a day (QID) | INTRAMUSCULAR | Status: DC | PRN

## 2016-10-26 MED ORDER — LORAZEPAM 1 MG PO TABS: 1.0000 mg | ORAL_TABLET | ORAL | Status: DC | PRN

## 2016-10-26 MED ORDER — MORPHINE BOLUS VIA INFUSION
1.0000 mg | INTRAVENOUS | Status: DC | PRN
  Administered 2016-10-27: 1 mg via INTRAVENOUS
  Filled 2016-10-26: qty 1

## 2016-10-26 MED ORDER — LORAZEPAM 2 MG/ML PO CONC: 1.0000 mg | ORAL | Status: DC | PRN

## 2016-10-26 MED ORDER — IOPAMIDOL (ISOVUE-370) INJECTION 76%
INTRAVENOUS | Status: AC
  Filled 2016-10-26: qty 50

## 2016-10-26 MED ORDER — ONDANSETRON 4 MG PO TBDP: 4.0000 mg | ORAL_TABLET | Freq: Four times a day (QID) | ORAL | Status: DC | PRN

## 2016-10-26 MED ORDER — IOPAMIDOL (ISOVUE-370) INJECTION 76%
INTRAVENOUS | Status: AC
Start: 2016-10-26 — End: 2016-10-26
  Administered 2016-10-26: 50 mL
  Filled 2016-10-26: qty 50

## 2016-10-26 MED ORDER — LORAZEPAM 2 MG/ML IJ SOLN
1.0000 mg | INTRAMUSCULAR | Status: DC | PRN
  Administered 2016-10-27 (×2): 1 mg via INTRAVENOUS
  Filled 2016-10-26 (×2): qty 1

## 2016-10-26 MED ORDER — ATROPINE SULFATE 1 % OP SOLN
4.0000 [drp] | OPHTHALMIC | Status: DC | PRN
  Administered 2016-10-27 (×3): 4 [drp] via SUBLINGUAL
  Filled 2016-10-26: qty 5
  Filled 2016-10-26: qty 2
  Filled 2016-10-26: qty 5

## 2016-10-26 NOTE — ED Provider Notes (Signed)
[facility]-EMERGENCY DEPT Provider Note   CSN: [id number] Arrival date & time: 11/14/2016  1536     History   Chief Complaint Chief Complaint  Patient presents with  . Code Stroke    HPI Zoe Hughes is a 83 y.o. female.  HPI  83 year old female who lives in independent living, was reportedly eating lunch this afternoon when she suddenly developed a right-sided gaze, and left-sided weakness. Reportedly this occurred at 2:50 PM. Family reports that she has been independent, and would not want any aggressive interventions.  Patient able to say one word answers on arrival, however not able to provide much history.   Past Medical History:  Diagnosis Date  . A-fib (HCC)   . Hypertension   . Hypothyroid     Patient Active Problem List   Diagnosis Date Noted  . Chest pain 07/19/2016  . Cerebrovascular accident (CVA) due to embolism of right middle cerebral artery (HCC)   . Dysarthria   . Cerebrovascular accident (CVA) (HCC)   . Chronic atrial fibrillation (HCC)   . HLD (hyperlipidemia)   . TIA (transient ischemic attack) 03/31/2016  . Persistent atrial fibrillation (HCC)   . Essential hypertension   . Thyroid activity decreased   . Pneumonia 10/23/2015  . Hyponatremia 08/22/2015    Past Surgical History:  Procedure Laterality Date  . ABDOMINAL HYSTERECTOMY    . CHOLECYSTECTOMY      OB History    No data available       Home Medications    Prior to Admission medications   Medication Sig Start Date End Date Taking? Authorizing Provider  acetaminophen (TYLENOL) 325 MG tablet Take 325 mg by mouth every 6 (six) hours as needed.    Historical Provider, MD  ALPRAZolam Prudy Feeler) 0.25 MG tablet Take 0.25 mg by mouth 2 (two) times daily as needed for anxiety.    Historical Provider, MD  apixaban (ELIQUIS) 2.5 MG TABS tablet Take 1 tablet (2.5 mg total) by mouth 2 (two) times daily. Patient not taking: Reported on 07/20/2016 04/04/16   Palma Holter, MD  aspirin 325 MG  tablet Take 325 mg by mouth every other day.    Historical Provider, MD  aspirin 81 MG chewable tablet Chew 1 tablet (81 mg total) by mouth daily. 07/20/16   Adrian Saran, MD  clotrimazole-betamethasone (LOTRISONE) cream Apply 1 application topically 2 (two) times daily.    Historical Provider, MD  diltiazem (CARDIZEM LA) 240 MG 24 hr tablet Take 240 mg by mouth daily.  08/06/15   Historical Provider, MD  fluticasone (FLONASE) 50 MCG/ACT nasal spray Place 2 sprays into both nostrils daily.    Historical Provider, MD  furosemide (LASIX) 20 MG tablet Take 20 mg by mouth.    Historical Provider, MD  isosorbide mononitrate (IMDUR) 30 MG 24 hr tablet Take 1 tablet (30 mg total) by mouth daily. 07/20/16   Sital Mody, MD  latanoprost (XALATAN) 0.005 % ophthalmic solution Place 1 drop into the right eye at bedtime.  08/13/15   Historical Provider, MD  levothyroxine (SYNTHROID, LEVOTHROID) 50 MCG tablet Take 50 mcg by mouth daily.  10/12/15   Historical Provider, MD  loratadine (CLARITIN) 10 MG tablet Take 10 mg by mouth daily.    Historical Provider, MD  pantoprazole (PROTONIX) 40 MG tablet Take 40 mg by mouth daily.    Historical Provider, MD  pravastatin (PRAVACHOL) 40 MG tablet Take 1 tablet (40 mg total) by mouth daily at 6 PM. Patient not taking: Reported on  07/20/2016 04/02/16   Palma Holter, MD  traZODone (DESYREL) 50 MG tablet Take 50 mg by mouth at bedtime as needed for sleep.    Historical Provider, MD    Family History No family history on file.  Social History Social History  Substance Use Topics  . Smoking status: Never Smoker  . Smokeless tobacco: Never Used  . Alcohol use No     Allergies   Clarithromycin; Fluviral [flu virus vaccine]; Aspirin; and Tetanus toxoid   Review of Systems Review of Systems  Unable to perform ROS: Mental status change     Physical Exam Updated Vital Signs BP (!) 180/78   Pulse 95   Temp 97.6 F (36.4 C) (Axillary)   Resp 18   SpO2 96%    Physical Exam  Constitutional: She appears well-developed and well-nourished. No distress.  HENT:  Head: Normocephalic and atraumatic.  Eyes: Conjunctivae and EOM are normal.  Neck: Normal range of motion.  Cardiovascular: Normal rate, regular rhythm, normal heart sounds and intact distal pulses.  Exam reveals no gallop and no friction rub.   No murmur heard. Pulmonary/Chest: Effort normal and breath sounds normal. No respiratory distress. She has no wheezes. She has no rales.  Abdominal: Soft. She exhibits no distension. There is no tenderness. There is no guarding.  Musculoskeletal: She exhibits no edema or tenderness.  Neurological: She is alert. A cranial nerve deficit (left facial droop, eyes deviated to right) is present.  Left sided hemineglect Left sided paralysis Able to say age, month Dysarthria   Skin: Skin is warm and dry. No rash noted. She is not diaphoretic. No erythema.  Nursing note and vitals reviewed.    ED Treatments / Results  Labs (all labs ordered are listed, but only abnormal results are displayed) Labs Reviewed  CBC - Abnormal; Notable for the following:       Result Value   RBC 5.12 (*)    All other components within normal limits  COMPREHENSIVE METABOLIC PANEL - Abnormal; Notable for the following:    Glucose, Bld 141 (*)    Creatinine, Ser 1.03 (*)    GFR calc non Af Amer 44 (*)    GFR calc Af Amer 51 (*)    All other components within normal limits  I-STAT CHEM 8, ED - Abnormal; Notable for the following:    BUN 26 (*)    Glucose, Bld 145 (*)    Calcium, Ion 1.09 (*)    Hemoglobin 15.3 (*)    All other components within normal limits  CBG MONITORING, ED - Abnormal; Notable for the following:    Glucose-Capillary 139 (*)    All other components within normal limits  CBG MONITORING, ED - Abnormal; Notable for the following:    Glucose-Capillary 145 (*)    All other components within normal limits  PROTIME-INR  APTT  DIFFERENTIAL   ETHANOL  RAPID URINE DRUG SCREEN, HOSP PERFORMED  URINALYSIS, ROUTINE W REFLEX MICROSCOPIC  I-STAT TROPOININ, ED    EKG  EKG Interpretation None       Radiology Ct Angio Head W Or Wo Contrast  Result Date: 10/18/2016 CLINICAL DATA:  Left-sided weakness. EXAM: CT ANGIOGRAPHY HEAD AND NECK TECHNIQUE: Multidetector CT imaging of the head and neck was performed using the standard protocol during bolus administration of intravenous contrast. Multiplanar CT image reconstructions and MIPs were obtained to evaluate the vascular anatomy. Carotid stenosis measurements (when applicable) are obtained utilizing NASCET criteria, using the distal internal carotid diameter  as the denominator. CONTRAST:  50 mL Isovue 370 COMPARISON:  Noncontrast head CT earlier today. Head MRI/ MRA 04/01/2016. FINDINGS: CTA NECK FINDINGS Aortic arch: Standard 3 vessel aortic arch with relatively extensive, predominantly calcified atherosclerotic plaque. Prominent calcified plaque in the left subclavian artery without significant stenosis. Right carotid system: Mild atherosclerotic plaque in the proximal to mid common carotid artery without stenosis. Moderate calcified plaque at the carotid bifurcation and in the proximal ICA without significant ICA stenosis. Mild proximal ECA stenosis. Left carotid system: Calcified and soft plaque in the proximal common carotid artery results in 65% stenosis. There is mild-to-moderate non stenotic plaque elsewhere throughout the common carotid artery. Moderately extensive calcified and soft plaque at the carotid bifurcation and in the proximal ICA without significant stenosis. Vertebral arteries: Patent with the right being mildly dominant. Mild right vertebral artery origin stenosis. Left vertebral artery compression at C5-6 due to degenerative vertebral spurring with moderate resultant stenosis. Skeleton: Advanced disc degeneration at C5-6 with asymmetric left-sided spurring resulting in left  neural foraminal stenosis. Advanced multilevel facet arthrosis. Partially calcified pannus posterior to the dens with chronic lucency in the base of the dens likely related to the arthropathy. Other neck: No mass or lymph node enlargement. Upper chest: Mild ground-glass opacity and mild interlobular septal thickening in the lung apices which may reflect mild edema. 1.2 cm subpleural nodular opacity with associated calcification in the apical right upper lobe is unchanged from a 07/20/2016 chest CT. Review of the MIP images confirms the above findings CTA HEAD FINDINGS Anterior circulation: The internal carotid arteries are patent from skullbase to carotid termini. There is mild bilateral siphon atherosclerosis without significant stenosis. There is proximal M1 occlusion on the right with only very minimal collateral supply to the anterior MCA territory. The right ACA is patent proximally but occludes at the distal A2 level. Left ACA and left MCA are patent without evidence of proximal branch occlusion or significant stenosis. 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 Posterior circulation: The intracranial vertebral arteries are widely patent to the basilar. PICA and SCA origins are patent. Right AICA origin is patent as well. Basilar artery is widely patent. There is a patent right posterior communicating artery. PCAs are patent without evidence of significant stenosis. No aneurysm. Venous sinuses: Patent. Anatomic variants: None. Delayed phase: Not performed. CTA source images demonstrate prominent lack of cortical enhancement throughout the majority of the right MCA territory, also involving the basal ganglia and distal right ACA territory. Review of the MIP images confirms the above findings IMPRESSION: 1. Right M1 MCA occlusion. 2. Right A2 ACA occlusion. 3. Only very minimal collateral supply with prominent lack of cortical enhancement throughout much of the right cerebral  hemisphere. A CT perfusion study is pending. 4. 65% proximal left common carotid artery stenosis. 5. No significant internal carotid artery stenosis. 6. Mild right vertebral artery stenosis at its origin. 7. 3 mm left supraclinoid ICA aneurysm. Critical Value/emergent results were called by telephone at the time of interpretation on 10/07/2016 at 4:09 pm to Dr. Ritta Slot , who verbally acknowledged these results. Electronically Signed   By: Sebastian Ache M.D.   On: 10/28/2016 16:47   Ct Angio Neck W Or Wo Contrast  Result Date: 10/09/2016 CLINICAL DATA:  Left-sided weakness. EXAM: CT ANGIOGRAPHY HEAD AND NECK TECHNIQUE: Multidetector CT imaging of the head and neck was performed using the standard protocol during bolus administration of intravenous contrast. Multiplanar CT image reconstructions and MIPs  were obtained to evaluate the vascular anatomy. Carotid stenosis measurements (when applicable) are obtained utilizing NASCET criteria, using the distal internal carotid diameter as the denominator. CONTRAST:  50 mL Isovue 370 COMPARISON:  Noncontrast head CT earlier today. Head MRI/ MRA 04/01/2016. FINDINGS: CTA NECK FINDINGS Aortic arch: Standard 3 vessel aortic arch with relatively extensive, predominantly calcified atherosclerotic plaque. Prominent calcified plaque in the left subclavian artery without significant stenosis. Right carotid system: Mild atherosclerotic plaque in the proximal to mid common carotid artery without stenosis. Moderate calcified plaque at the carotid bifurcation and in the proximal ICA without significant ICA stenosis. Mild proximal ECA stenosis. Left carotid system: Calcified and soft plaque in the proximal common carotid artery results in 65% stenosis. There is mild-to-moderate non stenotic plaque elsewhere throughout the common carotid artery. Moderately extensive calcified and soft plaque at the carotid bifurcation and in the proximal ICA without significant stenosis.  Vertebral arteries: Patent with the right being mildly dominant. Mild right vertebral artery origin stenosis. Left vertebral artery compression at C5-6 due to degenerative vertebral spurring with moderate resultant stenosis. Skeleton: Advanced disc degeneration at C5-6 with asymmetric left-sided spurring resulting in left neural foraminal stenosis. Advanced multilevel facet arthrosis. Partially calcified pannus posterior to the dens with chronic lucency in the base of the dens likely related to the arthropathy. Other neck: No mass or lymph node enlargement. Upper chest: Mild ground-glass opacity and mild interlobular septal thickening in the lung apices which may reflect mild edema. 1.2 cm subpleural nodular opacity with associated calcification in the apical right upper lobe is unchanged from a 07/20/2016 chest CT. Review of the MIP images confirms the above findings CTA HEAD FINDINGS Anterior circulation: The internal carotid arteries are patent from skullbase to carotid termini. There is mild bilateral siphon atherosclerosis without significant stenosis. There is proximal M1 occlusion on the right with only very minimal collateral supply to the anterior MCA territory. The right ACA is patent proximally but occludes at the distal A2 level. Left ACA and left MCA are patent without evidence of proximal branch occlusion or significant stenosis. 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 Posterior circulation: The intracranial vertebral arteries are widely patent to the basilar. PICA and SCA origins are patent. Right AICA origin is patent as well. Basilar artery is widely patent. There is a patent right posterior communicating artery. PCAs are patent without evidence of significant stenosis. No aneurysm. Venous sinuses: Patent. Anatomic variants: None. Delayed phase: Not performed. CTA source images demonstrate prominent lack of cortical enhancement throughout the majority of the right  MCA territory, also involving the basal ganglia and distal right ACA territory. Review of the MIP images confirms the above findings IMPRESSION: 1. Right M1 MCA occlusion. 2. Right A2 ACA occlusion. 3. Only very minimal collateral supply with prominent lack of cortical enhancement throughout much of the right cerebral hemisphere. A CT perfusion study is pending. 4. 65% proximal left common carotid artery stenosis. 5. No significant internal carotid artery stenosis. 6. Mild right vertebral artery stenosis at its origin. 7. 3 mm left supraclinoid ICA aneurysm. Critical Value/emergent results were called by telephone at the time of interpretation on 15-Nov-2016 at 4:09 pm to Dr. Ritta Slot , who verbally acknowledged these results. Electronically Signed   By: Sebastian Ache M.D.   On: Nov 15, 2016 16:47   Ct Cerebral Perfusion W Contrast  Result Date: Nov 15, 2016 CLINICAL DATA:  Code stroke.  Left-sided weakness. EXAM: CT PERFUSION BRAIN TECHNIQUE: Multiphase CT imaging of  the brain was performed following IV bolus contrast injection. Subsequent parametric perfusion maps were calculated using RAPID software. CONTRAST:  40 cc Isovue 370 intravenous COMPARISON:  CT and CTA from earlier today. FINDINGS: CT Brain Perfusion Findings: CBF (<30%) Volume: . Some of this volume includes the prior branch infarcts which occurred in September and since September, but there is still large completed infarct ridging these areas of chronic ischemia. Perfusion (Tmax>6.0s) volume: Mismatch Volume: Infarction Location:Right MCA territory and posterior watershed. These results were called by telephone at the time of interpretation on 11/01/16 at 4:31 pm to Dr. Ritta Slot , who verbally acknowledged these results. IMPRESSION: 1. Ischemia throughout the right MCA and distal ACA territory. 2. Infarct core measures 109 cc. Although some volume of this includes remote infarcts seen on previous imaging, the  patient's acute infarct is still extensive. Electronically Signed   By: Marnee Spring M.D.   On: 11/01/2016 16:39   Ct Head Code Stroke W/o Cm  Result Date: 11-01-16 CLINICAL DATA:  Code stroke.  Left-sided weakness EXAM: CT HEAD WITHOUT CONTRAST TECHNIQUE: Contiguous axial images were obtained from the base of the skull through the vertex without intravenous contrast. COMPARISON:  04/01/2016 FINDINGS: Brain: Remote cortically based infarcts in the right parietal and superior temporal lobes, which were acute on prior study. There has been an interval but chronic appearing infarct in the posterior right frontal cortex. Small remote left cerebellar infarct. No acute infarct noted. Vascular: Dense MCA on the right.  Atherosclerosis. Skull: No acute finding Sinuses/Orbits: No acute finding Other: Critical Value/emergent results were called by telephone at the time of interpretation on 2016-11-01 at 3:54 pm to Dr. Amada Jupiter, who verbally acknowledged these results. ASPECTS ([place] Stroke Program Early CT Score) Not scored given the number of remote infarcts in territory of interest. IMPRESSION: 1. Dense right MCA. 2. Remote infarcts in the right MCA territory, which have increased in number since September 2017. 3. No acute hemorrhage. Electronically Signed   By: Marnee Spring M.D.   On: 11/01/2016 15:56    Procedures Procedures (including critical care time)  Medications Ordered in ED Medications  iopamidol (ISOVUE-370) 76 % injection (not administered)  iopamidol (ISOVUE-370) 76 % injection (50 mLs  Contrast Given Nov 01, 2016 1556)   CRITICAL CARE: code stroke, consideration of tPA/intervention Performed by: Rhae Lerner   Total critical care time: 30 minutes  Critical care time was exclusive of separately billable procedures and treating other patients.  Critical care was necessary to treat or prevent imminent or life-threatening deterioration.  Critical care was time spent  personally by me on the following activities: development of treatment plan with patient and/or surrogate as well as nursing, discussions with consultants, evaluation of patient's response to treatment, examination of patient, obtaining history from patient or surrogate, ordering and performing treatments and interventions, ordering and review of laboratory studies, ordering and review of radiographic studies, pulse oximetry and re-evaluation of patient's condition.   Initial Impression / Assessment and Plan / ED Course  I have reviewed the triage vital signs and the nursing notes.  Pertinent labs & imaging results that were available during my care of the patient were reviewed by me and considered in my medical decision making (see chart for details).    83 year old female with a history of hypertension and atrial fibrillation who resides in independent living, presented with concern for left sided weakness, hemi-neglect with onset of symptoms at 2:50 PM. Patient presents with a code stroke, with neurologist  Dr. Amada Jupiter present on arrival. CT shows a dense right MCA infarct, with CT angiogram showing M1 and A2 cut off, and CT perfusion showing large area of infarct.  Dr. Amada Jupiter and I discussed other interventions, however pt on eliquis and after discussion of family reports her wishes would be comfort only.  On my reevaluation, patient is unable to respond to questioning, it is more somnolent. Family reports they would like comfort care only and for patient to be DO NOT RESUSCITATE. I support this decision, and placed comfort care orders. Consulted hospitalist for admission.  Final Clinical Impressions(s) / ED Diagnoses   Final diagnoses:  Stroke (cerebrum) (HCC)  Stroke (cerebrum) (HCC)  Stroke [facility])    New Prescriptions New Prescriptions   No medications on file     Alvira Monday, MD 10/27/16 [phone]

## 2016-10-26 NOTE — Code Documentation (Signed)
83 y.o. Female from independent living facility with PMHx of a-fibb, CVA and HTN. She was stated to be in her normal state of health earlier today. Around 1450 while eating lunch, she had an acute onset of left sided weakness and left sided facial droop. [facility] was notified and brought her to [facility] in code stroke fashion. Upon arrival, labs were drawn, airway cleared and pt taken to CT. Patient is accompanied with DNR form from her facility. CT showing right hyperdense MCA sign and remote infarcts in the right MCA territory which prevented an ASPECTS score from being calculated. CTA showing Right M1 and right A2 occlusion. Family being contacted to discuss anticoagulation use and possibility of thrombectomy. Family states pt is on Eliquis. tPA not given d/t pt being on Eliquis. The patient's son stated he did not want to proceed with thrombectomy decision over the phone and would be arriving to [facility] soon. Pt taken back to ED. While in ED, the decision to attain a CTP was made. Pt taken back to CT for CTP. CTP showing ischemia throughout the right MCA and distal ACA territory. Infarct core measures 109 cc. Pt taken back to the ED awaiting sons arrival. Upon son arriving, the decision to not proceed with thrombectomy was made. Son wishes to make his mother comfortable. Bedside handoff with ED RN [place].

## 2016-10-26 NOTE — H&P (Deleted)
History and physical      History obtained from:  EMS  HPI:                                                                                                                                         Zoe Hughes is an 83 y.o. female who lives at independent living and was eating lunch this afternoon when suddenly she showed a right gaze deviation and left flaccidity. She is on Eliquis and is not a tPA candidate. CT did not show a bleed and CTA head and neck was emergently obtain. Daughter was called to obtain for consent who wanted to make decision with her brother if intraarterial intervention was to be done.   Date last known well: Date: 11/05/16 Time last known well: Time: 14:50 tPA Given: No: on AC  Modified Rankin: Rankin Score=2    Past Medical History:  Diagnosis Date  . A-fib (HCC)   . Hypertension   . Hypothyroid     Past Surgical History:  Procedure Laterality Date  . ABDOMINAL HYSTERECTOMY    . CHOLECYSTECTOMY      No family history on file. Social History:  reports that she has never smoked. She has never used smokeless tobacco. She reports that she does not drink alcohol or use drugs.  Allergies:  Allergies  Allergen Reactions  . Clarithromycin Diarrhea  . Aspirin Other (See Comments)    Other reaction(s): Blood Disorder-thin blood too much when patient takes too much or high doses of aspirin.  . Tetanus Toxoid Rash    Medications:                                                                                                                           No current facility-administered medications for this encounter.    Current Outpatient Prescriptions  Medication Sig Dispense Refill  . acetaminophen (TYLENOL) 325 MG tablet Take 325 mg by mouth every 6 (six) hours as needed.    . ALPRAZolam (XANAX) 0.25 MG tablet Take 0.25 mg by mouth 2 (two) times daily as needed for anxiety.    Marland Kitchen apixaban (ELIQUIS) 2.5 MG TABS tablet Take 1 tablet (2.5 mg total)  by mouth 2 (two) times daily. (Patient not taking: Reported on 07/20/2016) 60 tablet 0  . aspirin 325 MG tablet Take 325 mg by mouth every other day.    Marland Kitchen  aspirin 81 MG chewable tablet Chew 1 tablet (81 mg total) by mouth daily. 120 tablet 0  . clotrimazole-betamethasone (LOTRISONE) cream Apply 1 application topically 2 (two) times daily.    Marland Kitchen diltiazem (CARDIZEM LA) 240 MG 24 hr tablet Take 240 mg by mouth daily.     . fluticasone (FLONASE) 50 MCG/ACT nasal spray Place 2 sprays into both nostrils daily.    . furosemide (LASIX) 20 MG tablet Take 20 mg by mouth.    . isosorbide mononitrate (IMDUR) 30 MG 24 hr tablet Take 1 tablet (30 mg total) by mouth daily. 30 tablet 0  . latanoprost (XALATAN) 0.005 % ophthalmic solution Place 1 drop into the right eye at bedtime.     Marland Kitchen levothyroxine (SYNTHROID, LEVOTHROID) 50 MCG tablet Take 50 mcg by mouth daily.     Marland Kitchen loratadine (CLARITIN) 10 MG tablet Take 10 mg by mouth daily.    . pantoprazole (PROTONIX) 40 MG tablet Take 40 mg by mouth daily.    . pravastatin (PRAVACHOL) 40 MG tablet Take 1 tablet (40 mg total) by mouth daily at 6 PM. (Patient not taking: Reported on 07/20/2016) 30 tablet 0  . traZODone (DESYREL) 50 MG tablet Take 50 mg by mouth at bedtime as needed for sleep.       ROS:                                                                                                                                       History obtained from unobtainable from patient due to mental status   Neurologic Examination:                                                                                                      There were no vitals taken for this visit.  HEENT-  Normocephalic, no lesions, without obvious abnormality.  Normal external eye and conjunctiva.  Normal TM's bilaterally.  Normal auditory canals and external ears. Normal external nose, mucus membranes and septum.  Normal pharynx. Cardiovascular- irregularly irregular rhythm, pulses palpable  throughout   Lungs- chest clear, no wheezing, rales, normal symmetric air entry Abdomen- normal findings: bowel sounds normal Extremities- no edema Lymph-no adenopathy palpable Musculoskeletal-no joint tenderness, deformity or swelling Skin-warm and dry, no hyperpigmentation, vitiligo, or suspicious lesions  Neurological Examination Mental Status: Alert, oriented, thought content appropriate.  Speech fluent without evidence of aphasia.  Able to follow 3 step commands without difficulty. Cranial Nerves: II: Discs flat bilaterally; Visual fields grossly normal,  III,IV, VI: ptosis not present, extra-ocular motions intact bilaterally, pupils equal, round, reactive to light and accommodation V,VII: smile symmetric, facial light touch sensation normal bilaterally VIII: hearing normal bilaterally IX,X: uvula rises symmetrically XI: bilateral shoulder shrug XII: midline tongue extension Motor: Right : Upper extremity   5/5    Left:     Upper extremity   5/5  Lower extremity   5/5     Lower extremity   5/5 Tone and bulk:normal tone throughout; no atrophy noted Sensory: Pinprick and light touch intact throughout, bilaterally Deep Tendon Reflexes: 2+ and symmetric throughout Plantars: Right: downgoing   Left: downgoing Cerebellar: normal finger-to-nose, normal rapid alternating movements and normal heel-to-shin test Gait: normal gait and station       Lab Results: Basic Metabolic Panel:  Recent Labs Lab 2016-11-16 1550  NA 140  K 4.1  CL 105  GLUCOSE 145*  BUN 26*  CREATININE 1.00    Liver Function Tests: No results for input(s): AST, ALT, ALKPHOS, BILITOT, PROT, ALBUMIN in the last 168 hours. No results for input(s): LIPASE, AMYLASE in the last 168 hours. No results for input(s): AMMONIA in the last 168 hours.  CBC:  Recent Labs Lab 16-Nov-2016 1550  HGB 15.3*  HCT 45.0    Cardiac Enzymes: No results for input(s): CKTOTAL, CKMB, CKMBINDEX, TROPONINI in the last 168  hours.  Lipid Panel: No results for input(s): CHOL, TRIG, HDL, CHOLHDL, VLDL, LDLCALC in the last 168 hours.  CBG:  Recent Labs Lab 16-Nov-2016 1542  GLUCAP 139*    Microbiology: Results for orders placed or performed during the hospital encounter of 03/31/16  MRSA PCR Screening     Status: None   Collection Time: 03/31/16  9:53 PM  Result Value Ref Range Status   MRSA by PCR NEGATIVE NEGATIVE Final    Comment:        The GeneXpert MRSA Assay (FDA approved for NASAL specimens only), is one component of a comprehensive MRSA colonization surveillance program. It is not intended to diagnose MRSA infection nor to guide or monitor treatment for MRSA infections.     Coagulation Studies: No results for input(s): LABPROT, INR in the last 72 hours.  Imaging: No results found.     Assessment and plan discussed with with attending physician and they are in agreement.    Felicie Morn PA-C [facility] [phone]  2016/11/16, 3:56 PM   Assessment: 83 y.o. female   Stroke Risk Factors - atrial fibrillation

## 2016-10-26 NOTE — H&P (Signed)
History and Physical        Hospital Admission Note Date: 10/07/2016  Patient name: Zoe Hughes Medical record number: [id number] Date of birth: 1918/10/30 Age: 83 y.o. Gender: female  PCP: FITZGERALD, DAVID Demetrius Charity, MD   Patient coming from: Independent living facility   Chief Complaint:  Presented with acute onset of left-sided weakness, facial drooping  HPI: Patient is a 83 year old female with history of atrial fibrillation on eliquis, hypertension, hypothyroidism, GERD, anxiety who resides at independent living facility was initially in her normal state of health earlier this morning. History was obtained from her patient's family at the bedside and ER records. Around 2:50 PM, patient was eating lunch when she had acute onset of left-sided weakness weakness and facial droop on the left side. EMS was called and patient was brought to ED. Code stroke was called and patient was evaluated by neurology. She was not considered TPA candidate as patient is currently on anticoagulation, eliquis. CT showed right hyperdense MCA infarct and remote infarcts in the right MCA territory. CT perfusion imaging was done which showed ischemia throughout the right MCA and distal ACA territory. Patient's son arrived at the bedside who discussed in detail with neurology and with family's wishes the decision was made not to proceed with thrombectomy and family decided for comfort care.   ED work-up/course:  Temp 97.6, respiratory rate 18, pulse 95, BP 180/78 O2 sats 96% on 2 L CT cerebral perfusion with contrast study:  Ischemia throughout the right MCA and distal ACA territory. 2. Infarct core measures 109 cc. Although some volume of this includes remote infarcts seen on previous imaging, the patient's acute infarct is still extensive.  Review of Systems: Positives marked in 'bold' Unable to assess  from the patient, currently on morphine drip, speech very garbled and not answering any questions  Past Medical History: Past Medical History:  Diagnosis Date  . A-fib (HCC)   . Hypertension   . Hypothyroid     Past Surgical History:  Procedure Laterality Date  . ABDOMINAL HYSTERECTOMY    . CHOLECYSTECTOMY      Medications: Prior to Admission medications   Medication Sig Start Date End Date Taking? Authorizing Provider  acetaminophen (TYLENOL) 325 MG tablet Take 325 mg by mouth every 6 (six) hours as needed.    Historical Provider, MD  ALPRAZolam Prudy Feeler) 0.25 MG tablet Take 0.25 mg by mouth 2 (two) times daily as needed for anxiety.    Historical Provider, MD  apixaban (ELIQUIS) 2.5 MG TABS tablet Take 1 tablet (2.5 mg total) by mouth 2 (two) times daily. Patient not taking: Reported on 07/20/2016 04/04/16   Palma Holter, MD  aspirin 325 MG tablet Take 325 mg by mouth every other day.    Historical Provider, MD  aspirin 81 MG chewable tablet Chew 1 tablet (81 mg total) by mouth daily. 07/20/16   Adrian Saran, MD  clotrimazole-betamethasone (LOTRISONE) cream Apply 1 application topically 2 (two) times daily.    Historical Provider, MD  diltiazem (CARDIZEM LA) 240 MG 24 hr tablet Take 240 mg by mouth daily.  08/06/15   Historical Provider, MD  fluticasone (FLONASE) 50 MCG/ACT nasal spray Place  2 sprays into both nostrils daily.    Historical Provider, MD  furosemide (LASIX) 20 MG tablet Take 20 mg by mouth.    Historical Provider, MD  isosorbide mononitrate (IMDUR) 30 MG 24 hr tablet Take 1 tablet (30 mg total) by mouth daily. 07/20/16   Sital Mody, MD  latanoprost (XALATAN) 0.005 % ophthalmic solution Place 1 drop into the right eye at bedtime.  08/13/15   Historical Provider, MD  levothyroxine (SYNTHROID, LEVOTHROID) 50 MCG tablet Take 50 mcg by mouth daily.  10/12/15   Historical Provider, MD  loratadine (CLARITIN) 10 MG tablet Take 10 mg by mouth daily.    Historical Provider, MD    pantoprazole (PROTONIX) 40 MG tablet Take 40 mg by mouth daily.    Historical Provider, MD  pravastatin (PRAVACHOL) 40 MG tablet Take 1 tablet (40 mg total) by mouth daily at 6 PM. Patient not taking: Reported on 07/20/2016 04/02/16   Palma Holter, MD  traZODone (DESYREL) 50 MG tablet Take 50 mg by mouth at bedtime as needed for sleep.    Historical Provider, MD    Allergies:   Allergies  Allergen Reactions  . Clarithromycin Diarrhea  . Fluviral [Flu Virus Vaccine] Other (See Comments)    Patient develops the flu  . Aspirin Other (See Comments)    Other reaction(s): Blood Disorder-thin blood too much when patient takes too much or high doses of aspirin.  . Tetanus Toxoid Rash    Social History:  Per patient's records, that she has never smoked. She has never used smokeless tobacco, does not drink alcohol or use drugs.  Family History: Patient unable to provide any family history at this time, currently on morphine drip  Physical Exam: Blood pressure (!) 180/78, pulse 95, temperature 97.6 F (36.4 C), temperature source Axillary, resp. rate 18, SpO2 96 %. General: Alert, awake, however speech garbled, continues to hold her son's hand otherwise not able to have any meaningful conversation, not oriented HEENT: normocephalic, atraumatic, anicteric sclera, pink conjunctiva, pupils equal and reactive to light and accomodation, oropharynx clear Neck: supple, no masses or lymphadenopathy, no goiter, no bruits  Heart: Regular rate and rhythm, without murmurs, rubs or gallops. Lungs: Clear to auscultation bilaterally, no wheezing, rales or rhonchi. Abdomen: Soft, nontender, nondistended, positive bowel sounds, no masses. Extremities: No clubbing, cyanosis or edema with positive pedal pulses. Neuro: follows simple commands, left-sided flaccid is 0/5 strength, able to move her right side,  Psych: alert, awake, not oriented  Skin: no rashes or lesions, warm and dry   LABS on  Admission:  Basic Metabolic Panel:  Recent Labs Lab 10/06/2016 1540 10/28/2016 1550  NA 140 140  K 3.9 4.1  CL 105 105  CO2 24  --   GLUCOSE 141* 145*  BUN 20 26*  CREATININE 1.03* 1.00  CALCIUM 9.5  --    Liver Function Tests:  Recent Labs Lab 10/04/2016 1540  AST 25  ALT 20  ALKPHOS 123  BILITOT 0.9  PROT 6.8  ALBUMIN 4.4   No results for input(s): LIPASE, AMYLASE in the last 168 hours. No results for input(s): AMMONIA in the last 168 hours. CBC:  Recent Labs Lab 10/02/2016 1540 10/30/2016 1550  WBC 10.4  --   NEUTROABS 5.7  --   HGB 14.8 15.3*  HCT 44.8 45.0  MCV 87.5  --   PLT 223  --    Cardiac Enzymes: No results for input(s): CKTOTAL, CKMB, CKMBINDEX, TROPONINI in the last 168 hours. BNP:  Invalid input(s): POCBNP CBG:  Recent Labs Lab 10/06/2016 1542 10/20/2016 1611  GLUCAP 139* 145*    Radiological Exams on Admission:  No results found.  *I have personally reviewed the images above*  EKG: Independently reviewed. Rate 79, atrial fibrillation   Assessment/Plan Principal Problem:   Acute CVA (cerebrovascular accident) (HCC) with underlying history of atrial fibrillation on anticoagulation - CT showed right hyperdense MCA infarct and remote infarcts in the right MCA territory. Not a TPA candidate secondary to anticoagulation, eliquis. Neurology was consulted. - CT perfusion imaging showed ischemia throughout the right MCA and distal ACA territory. Patient's son and other family members discussed in detail with neurology and with family's wishes the decision was made not to proceed with thrombectomy and family decided for comfort care.  - Patient started on morphine drip, Ativan, sublingual atropine - NPO, will place palliative care consult for assistance with symptom management, family agrees    Active Problems:   Chronic atrial fibrillation (HCC) - hold chronic cardiac medications and anticoagulation   DVT prophylaxis: SCDs  CODE STATUS: DO NOT  RESUSCITATE comfort care  Consults called: Neurology, palliative  Family Communication: Admission, patients condition and plan of care including tests being ordered have been discussed with the patient's family including her son, daughter, son-in-law and daughter-in-law who indicates understanding and agree with the plan and Code Status  Admission status: Inpatient, MedSurg, palliative floor  Disposition plan: Expecting hospital death  At the time of admission, it appears that the appropriate admission status for this patient is INPATIENT . This is judged to be reasonable and necessary in order to provide the required intensity of service to ensure the patient's safety given the presenting symptoms, physical exam findings, and initial radiographic and laboratory data in the context of their chronic comorbidities.     Time Spent on Admission: 60 minutes   Alysiana Ethridge M.D. [facility] 10/23/2016, 6:04 PM Pager: [phone]  If 7PM-7AM, please contact night-coverage [email or web address] Password [email or web address]

## 2016-10-26 NOTE — ED Notes (Signed)
Attempted report 

## 2016-10-26 NOTE — ED Notes (Signed)
Report called  

## 2016-10-26 NOTE — ED Triage Notes (Signed)
Pt here from assisted living as a code stroke , lsn 1450 , pt presents with left side flaccid and aphasic

## 2016-10-26 NOTE — Care Management (Addendum)
ED CM noted patient to be from an independent living home in [place]. Son Deionna Marcantonio is listed as emergency contact [phone]. Patient presented to the [facility] ED Code Stroke, work up still in progress.

## 2016-10-26 NOTE — Consult Note (Signed)
consult      History obtained from:  EMS  HPI:                                                                                                                                         Zoe Hughes is an 83 y.o. female who lives at independent living and was eating lunch this afternoon when suddenly she showed a right gaze deviation and left flaccidity. She is on Eliquis and is not a tPA candidate. CT did not show a bleed and CTA head and neck was emergently obtain. Daughter was called to obtain for consent who wanted to make decision with her brother if intraarterial intervention was to be done.   Date last known well: Date: 11/02/2016 Time last known well: Time: 1500 tPA Given: No: on anticoagulation  Modified Rankin: Rankin Score=2    Past Medical History:  Diagnosis Date  . A-fib (HCC)   . Hypertension   . Hypothyroid     Past Surgical History:  Procedure Laterality Date  . ABDOMINAL HYSTERECTOMY    . CHOLECYSTECTOMY      No family history on file. Social History:  reports that she has never smoked. She has never used smokeless tobacco. She reports that she does not drink alcohol or use drugs.  Allergies:  Allergies  Allergen Reactions  . Clarithromycin Diarrhea  . Aspirin Other (See Comments)    Other reaction(s): Blood Disorder-thin blood too much when patient takes too much or high doses of aspirin.  . Tetanus Toxoid Rash    Medications:                                                                                                                           No current facility-administered medications for this encounter.    Current Outpatient Prescriptions  Medication Sig Dispense Refill  . acetaminophen (TYLENOL) 325 MG tablet Take 325 mg by mouth every 6 (six) hours as needed.    . ALPRAZolam (XANAX) 0.25 MG tablet Take 0.25 mg by mouth 2 (two) times daily as needed for anxiety.    Marland Kitchen apixaban (ELIQUIS) 2.5 MG TABS tablet Take 1 tablet (2.5 mg  total) by mouth 2 (two) times daily. (Patient not taking: Reported on 07/20/2016) 60 tablet 0  . aspirin 325 MG tablet Take 325 mg by mouth every other day.    Marland Kitchen  aspirin 81 MG chewable tablet Chew 1 tablet (81 mg total) by mouth daily. 120 tablet 0  . clotrimazole-betamethasone (LOTRISONE) cream Apply 1 application topically 2 (two) times daily.    Marland Kitchen diltiazem (CARDIZEM LA) 240 MG 24 hr tablet Take 240 mg by mouth daily.     . fluticasone (FLONASE) 50 MCG/ACT nasal spray Place 2 sprays into both nostrils daily.    . furosemide (LASIX) 20 MG tablet Take 20 mg by mouth.    . isosorbide mononitrate (IMDUR) 30 MG 24 hr tablet Take 1 tablet (30 mg total) by mouth daily. 30 tablet 0  . latanoprost (XALATAN) 0.005 % ophthalmic solution Place 1 drop into the right eye at bedtime.     Marland Kitchen levothyroxine (SYNTHROID, LEVOTHROID) 50 MCG tablet Take 50 mcg by mouth daily.     Marland Kitchen loratadine (CLARITIN) 10 MG tablet Take 10 mg by mouth daily.    . pantoprazole (PROTONIX) 40 MG tablet Take 40 mg by mouth daily.    . pravastatin (PRAVACHOL) 40 MG tablet Take 1 tablet (40 mg total) by mouth daily at 6 PM. (Patient not taking: Reported on 07/20/2016) 30 tablet 0  . traZODone (DESYREL) 50 MG tablet Take 50 mg by mouth at bedtime as needed for sleep.       ROS:                                                                                                                                       History obtained from unobtainable from patient due to mental status   Neurologic Examination:                                                                                                      There were no vitals taken for this visit.  HEENT-  Normocephalic, no lesions, without obvious abnormality.  Normal external eye and conjunctiva.  Normal TM's bilaterally.  Normal auditory canals and external ears. Normal external nose, mucus membranes and septum.  Normal pharynx. Cardiovascular- irregularly irregular rhythm, pulses  palpable throughout   Lungs- chest clear, no wheezing, rales, normal symmetric air entry Abdomen- normal findings: bowel sounds normal Extremities- no edema Lymph-no adenopathy palpable Musculoskeletal-no joint tenderness, deformity or swelling Skin-warm and dry, no hyperpigmentation, vitiligo, or suspicious lesions  Neurological Examination Mental Status: Alert,not oriented, dysarthric and able to follow some simple commands with right side such as lifting arm and leg or stating she sees my fingers.  Cranial Nerves: II: left hemianopsia  III,IV, VI: ptosis not present, right gaze deviation, pupils equal, round, reactive to light and accommodation V,VII: left facial droop, facial light touch sensation difficult to assess VIII: hearing normal bilaterally XII: midline tongue extension Motor: Right : Upper extremity   5/5    Left:     Upper extremity   0/5  Lower extremity   5/5     Lower extremity   0/5 --left leg triple flexion Tone and bulk:normal tone throughout; no atrophy noted Sensory: difficult to assess due to dysarthria and difficulty answering questions but does not withdraw from pain Deep Tendon Reflexes: 2+ and symmetric throughout Plantars: Right: downgoing   Left: upgoing Cerebellar: Unable to assess Gait: not tested       Lab Results: Basic Metabolic Panel:  Recent Labs Lab 10/13/2016 1550  NA 140  K 4.1  CL 105  GLUCOSE 145*  BUN 26*  CREATININE 1.00    Liver Function Tests: No results for input(s): AST, ALT, ALKPHOS, BILITOT, PROT, ALBUMIN in the last 168 hours. No results for input(s): LIPASE, AMYLASE in the last 168 hours. No results for input(s): AMMONIA in the last 168 hours.  CBC:  Recent Labs Lab 10/14/2016 1550  HGB 15.3*  HCT 45.0    Cardiac Enzymes: No results for input(s): CKTOTAL, CKMB, CKMBINDEX, TROPONINI in the last 168 hours.  Lipid Panel: No results for input(s): CHOL, TRIG, HDL, CHOLHDL, VLDL, LDLCALC in the last 168  hours.  CBG:  Recent Labs Lab 10/21/2016 1542  GLUCAP 139*    Microbiology: Results for orders placed or performed during the hospital encounter of 03/31/16  MRSA PCR Screening     Status: None   Collection Time: 03/31/16  9:53 PM  Result Value Ref Range Status   MRSA by PCR NEGATIVE NEGATIVE Final    Comment:        The GeneXpert MRSA Assay (FDA approved for NASAL specimens only), is one component of a comprehensive MRSA colonization surveillance program. It is not intended to diagnose MRSA infection nor to guide or monitor treatment for MRSA infections.     Coagulation Studies: No results for input(s): LABPROT, INR in the last 72 hours.  Imaging: No results found.     Assessment and plan discussed with with attending physician and they are in agreement.    Zoe Morn PA-C [facility] [phone]  10/11/2016, 3:56 PM   Assessment: 83 y.o. female With large right MCA infarct. Given the size of the infarct, and aged patient, after discussion with family, they do not feel that she would want intervention or in fact any other life-prolonging measures.  I think this is reasonable, and agree with move to only comfort care.  I did discuss with the son that even with no intervention, it is possible that she could survive this, but I think that given her age and the severity of the stroke like expectancy would be rather low. He expressed understanding.   Stroke Risk Factors - atrial fibrillation  1) comfort only measures   Ritta Slot, MD [facility] [phone]  If 7pm- 7am, please page neurology on call as listed in AMION.

## 2016-10-26 NOTE — ED Notes (Signed)
Back from Ct scan , Son at bedside

## 2016-10-27 DIAGNOSIS — Z515 Encounter for palliative care: Secondary | ICD-10-CM

## 2016-10-27 DIAGNOSIS — Z7189 Other specified counseling: Secondary | ICD-10-CM

## 2016-10-27 MED ORDER — GLYCOPYRROLATE 0.2 MG/ML IJ SOLN
0.2000 mg | INTRAMUSCULAR | Status: DC | PRN
  Administered 2016-10-27 – 2016-10-28 (×5): 0.2 mg via INTRAVENOUS
  Filled 2016-10-27 (×5): qty 1

## 2016-10-27 MED ORDER — GLYCOPYRROLATE 0.2 MG/ML IJ SOLN
0.2000 mg | INTRAMUSCULAR | Status: DC | PRN
  Administered 2016-10-27 (×2): 0.2 mg via INTRAVENOUS
  Filled 2016-10-27 (×2): qty 1

## 2016-10-27 MED ORDER — GLYCOPYRROLATE 1 MG PO TABS
1.0000 mg | ORAL_TABLET | ORAL | Status: DC | PRN
  Filled 2016-10-27: qty 1

## 2016-10-27 MED ORDER — GLYCOPYRROLATE 0.2 MG/ML IJ SOLN: 0.2000 mg | INTRAMUSCULAR | Status: DC | PRN

## 2016-10-27 MED ORDER — HALOPERIDOL LACTATE 2 MG/ML PO CONC: 0.5000 mg | ORAL | Status: DC | PRN

## 2016-10-27 MED ORDER — ATROPINE SULFATE 1 % OP SOLN
4.0000 [drp] | OPHTHALMIC | Status: DC | PRN
  Administered 2016-10-27 (×2): 4 [drp] via SUBLINGUAL

## 2016-10-27 MED ORDER — HALOPERIDOL 1 MG PO TABS: 0.5000 mg | ORAL_TABLET | ORAL | Status: DC | PRN

## 2016-10-27 MED ORDER — HALOPERIDOL LACTATE 5 MG/ML IJ SOLN: 0.5000 mg | INTRAMUSCULAR | Status: DC | PRN

## 2016-10-27 NOTE — Progress Notes (Signed)
Spoke with MD Rai about patient having excessive secretions not relieved by medicine, she said I could adjust the Robinol and Atropine drops to q2h and I set up oral suctioning and taught family to use that. Will continue to monitor.

## 2016-10-27 NOTE — Progress Notes (Signed)
[facility]                                                                              Patient Demographics  Zoe Hughes, is a 83 y.o. female, DOB - Jan 07, 1919, ZOX:[id number]  Admit date - 10/21/2016   Admitting Physician Aira Sallade Jenna Luo, MD  Outpatient Primary MD for the patient is Mick Sell, MD  Outpatient specialists:   LOS - 1  days    Chief Complaint  Patient presents with  . Code Stroke       Brief summary   Patient is a 83 year old female with history of atrial fibrillation on eliquis, hypertension, hypothyroidism, GERD, anxiety who resides at independent living facility was initially in her normal state of health earlier this morning. History was obtained from her patient's family at the bedside and ER records. Around 2:50 PM, patient was eating lunch when she had acute onset of left-sided weakness weakness and facial droop on the left side. EMS was called and patient was brought to ED. Code stroke was called and patient was evaluated by neurology. She was not considered TPA candidate as patient is currently on anticoagulation, eliquis. CT showed right hyperdense MCA infarct and remote infarcts in the right MCA territory. CT perfusion imaging was done which showed ischemia throughout the right MCA and distal ACA territory. Patient's son arrived at the bedside who discussed in detail with neurology and with family's wishes the decision was made not to proceed with thrombectomy and family decided for comfort care.   Assessment & Plan   Principal Problem:   Acute CVA (cerebrovascular accident) [facility]) with underlying history of atrial fibrillation on anticoagulation - CT showed right hyperdense MCA infarct and remote infarcts in the right MCA territory. Not a TPA candidate secondary to anticoagulation, eliquis. Neurology was consulted. - CT perfusion imaging showed ischemia throughout the right MCA and distal ACA territory. Patient's son and other  family members discussed in detail with neurology and with family's wishes the decision was made not to proceed with thrombectomy and family decided for comfort care.  - Patient started on morphine drip, Ativan, sublingual atropine - NPO, palliative medicine following, appreciate recommendations  - Poor prognosis, expecting hospital death, hours to days  Active Problems:   Chronic atrial fibrillation (HCC) - hold chronic cardiac medications and anticoagulation    Code Status: DO NOT RESUSCITATE, comfort care DVT Prophylaxis:  Comfort care Family Communication: Discussed in detail with the patient, all imaging results, lab results explained to the patient's daughter-in-law and granddaughter at the bedside   Disposition Plan: Expecting hospital death  Time Spent in minutes   15 minutes  Procedures:  CT head CT perfusion study  Consultants:   Neurology Palliative medicine  Antimicrobials:      Medications  Scheduled Meds: Continuous Infusions: . morphine 2 mg/hr (10/27/16 1114)   PRN Meds:.antiseptic oral rinse, atropine, glycopyrrolate **OR** glycopyrrolate **OR** glycopyrrolate, haloperidol **OR** haloperidol **OR** haloperidol lactate, LORazepam **OR** LORazepam **OR** LORazepam, morphine, ondansetron **OR** ondansetron (ZOFRAN) IV   Antibiotics   Anti-infectives    None        Subjective:   Ladean Raya was  seen and examined today05/10/18sponsive on morphine drip, appears to be comfortable  Objective:   Vitals:   03/29/2017 1820 2016-11-09 1902 2016/11/09 2000 10/27/16 0500  BP: (!) 198/71 (!) 188/94 (!) 170/82 (!) 136/95  Pulse: 94 100 99 70  Resp: Temp:   (!) 96.1 F (35.6 C) 97.7 F (36.5 C)  TempSrc:   Axillary Axillary  SpO2: 98% 99% 99% (!) 81%    Intake/Output Summary (Last 24 hours) at 10/27/16 1140 Last data filed at 10/27/16 1022  Gross per 24 hour  Intake                0 ml  Output                0 ml  Net                0  ml     Wt Readings from Last 3 Encounters:  07/19/16 51.3 kg (113 lb)  03/31/16 51.3 kg (113 lb)  10/24/15 54.5 kg (120 lb 3.2 oz)     Exam  General: Unresponsive, on morphine drip, frail, eyes closed  HEENT:    Neck:   Cardiovascular: S1 S2 clear, regular rhythm  Respiratory: Coarse breath sounds  Gastrointestinal:   Ext: no edema  Neuro: unresponsive  Skin:   Psych: unresponsive   Data Reviewed:  I have personally reviewed following labs and imaging studies  Micro Results No results found for this or any previous visit (from the past 240 hour(s)).  Radiology Reports Ct Angio Head W Or Wo Contrast  Result Date: Nov 09, 2016 CLINICAL DATA:  Left-sided weakness. EXAM: CT ANGIOGRAPHY HEAD AND NECK TECHNIQUE: Multidetector CT imaging of the head and neck was performed using the standard protocol during bolus administration of intravenous contrast. Multiplanar CT image reconstructions and MIPs were obtained to evaluate the vascular anatomy. Carotid stenosis measurements (when applicable) are obtained utilizing NASCET criteria, using the distal internal carotid diameter as the denominator. CONTRAST:  50 mL Isovue 370 COMPARISON:  Noncontrast head CT earlier today. Head MRI/ MRA 04/01/2016. FINDINGS: CTA NECK FINDINGS Aortic arch: Standard 3 vessel aortic arch with relatively extensive, predominantly calcified atherosclerotic plaque. Prominent calcified plaque in the left subclavian artery without significant stenosis. Right carotid system: Mild atherosclerotic plaque in the proximal to mid common carotid artery without stenosis. Moderate calcified plaque at the carotid bifurcation and in the proximal ICA without significant ICA stenosis. Mild proximal ECA stenosis. Left carotid system: Calcified and soft plaque in the proximal common carotid artery results in 65% stenosis. There is mild-to-moderate non stenotic plaque elsewhere throughout the common carotid artery. Moderately  extensive calcified and soft plaque at the carotid bifurcation and in the proximal ICA without significant stenosis. Vertebral arteries: Patent with the right being mildly dominant. Mild right vertebral artery origin stenosis. Left vertebral artery compression at C5-6 due to degenerative vertebral spurring with moderate resultant stenosis. Skeleton: Advanced disc degeneration at C5-6 with asymmetric left-sided spurring resulting in left neural foraminal stenosis. Advanced multilevel facet arthrosis. Partially calcified pannus posterior to the dens with chronic lucency in the base of the dens likely related to the arthropathy. Other neck: No mass or lymph node enlargement. Upper chest: Mild ground-glass opacity and mild interlobular septal thickening in the lung apices which may reflect mild edema. 1.2 cm subpleural nodular opacity with associated calcification in the apical right upper lobe is unchanged from a 07/20/2016 chest CT. Review of the MIP images confirms the above findings  CTA HEAD FINDINGS Anterior circulation: The internal carotid arteries are patent from skullbase to carotid termini. There is mild bilateral siphon atherosclerosis without significant stenosis. There is proximal M1 occlusion on the right with only very minimal collateral supply to the anterior MCA territory. The right ACA is patent proximally but occludes at the distal A2 level. Left ACA and left MCA are patent without evidence of proximal branch occlusion or significant stenosis. 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 Posterior circulation: The intracranial vertebral arteries are widely patent to the basilar. PICA and SCA origins are patent. Right AICA origin is patent as well. Basilar artery is widely patent. There is a patent right posterior communicating artery. PCAs are patent without evidence of significant stenosis. No aneurysm. Venous sinuses: Patent. Anatomic variants: None. Delayed phase: Not  performed. CTA source images demonstrate prominent lack of cortical enhancement throughout the majority of the right MCA territory, also involving the basal ganglia and distal right ACA territory. Review of the MIP images confirms the above findings IMPRESSION: 1. Right M1 MCA occlusion. 2. Right A2 ACA occlusion. 3. Only very minimal collateral supply with prominent lack of cortical enhancement throughout much of the right cerebral hemisphere. A CT perfusion study is pending. 4. 65% proximal left common carotid artery stenosis. 5. No significant internal carotid artery stenosis. 6. Mild right vertebral artery stenosis at its origin. 7. 3 mm left supraclinoid ICA aneurysm. Critical Value/emergent results were called by telephone at the time of interpretation on 15-Nov-2016 at 4:09 pm to Dr. Ritta Slot , who verbally acknowledged these results. Electronically Signed   By: Sebastian Ache M.D.   On: 11/15/16 16:47   Ct Angio Neck W Or Wo Contrast  Result Date: 11/15/16 CLINICAL DATA:  Left-sided weakness. EXAM: CT ANGIOGRAPHY HEAD AND NECK TECHNIQUE: Multidetector CT imaging of the head and neck was performed using the standard protocol during bolus administration of intravenous contrast. Multiplanar CT image reconstructions and MIPs were obtained to evaluate the vascular anatomy. Carotid stenosis measurements (when applicable) are obtained utilizing NASCET criteria, using the distal internal carotid diameter as the denominator. CONTRAST:  50 mL Isovue 370 COMPARISON:  Noncontrast head CT earlier today. Head MRI/ MRA 04/01/2016. FINDINGS: CTA NECK FINDINGS Aortic arch: Standard 3 vessel aortic arch with relatively extensive, predominantly calcified atherosclerotic plaque. Prominent calcified plaque in the left subclavian artery without significant stenosis. Right carotid system: Mild atherosclerotic plaque in the proximal to mid common carotid artery without stenosis. Moderate calcified plaque at the  carotid bifurcation and in the proximal ICA without significant ICA stenosis. Mild proximal ECA stenosis. Left carotid system: Calcified and soft plaque in the proximal common carotid artery results in 65% stenosis. There is mild-to-moderate non stenotic plaque elsewhere throughout the common carotid artery. Moderately extensive calcified and soft plaque at the carotid bifurcation and in the proximal ICA without significant stenosis. Vertebral arteries: Patent with the right being mildly dominant. Mild right vertebral artery origin stenosis. Left vertebral artery compression at C5-6 due to degenerative vertebral spurring with moderate resultant stenosis. Skeleton: Advanced disc degeneration at C5-6 with asymmetric left-sided spurring resulting in left neural foraminal stenosis. Advanced multilevel facet arthrosis. Partially calcified pannus posterior to the dens with chronic lucency in the base of the dens likely related to the arthropathy. Other neck: No mass or lymph node enlargement. Upper chest: Mild ground-glass opacity and mild interlobular septal thickening in the lung apices which may reflect mild edema. 1.2 cm subpleural nodular opacity with associated  calcification in the apical right upper lobe is unchanged from a 07/20/2016 chest CT. Review of the MIP images confirms the above findings CTA HEAD FINDINGS Anterior circulation: The internal carotid arteries are patent from skullbase to carotid termini. There is mild bilateral siphon atherosclerosis without significant stenosis. There is proximal M1 occlusion on the right with only very minimal collateral supply to the anterior MCA territory. The right ACA is patent proximally but occludes at the distal A2 level. Left ACA and left MCA are patent without evidence of proximal branch occlusion or significant stenosis. 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 Posterior circulation: The intracranial vertebral arteries are widely  patent to the basilar. PICA and SCA origins are patent. Right AICA origin is patent as well. Basilar artery is widely patent. There is a patent right posterior communicating artery. PCAs are patent without evidence of significant stenosis. No aneurysm. Venous sinuses: Patent. Anatomic variants: None. Delayed phase: Not performed. CTA source images demonstrate prominent lack of cortical enhancement throughout the majority of the right MCA territory, also involving the basal ganglia and distal right ACA territory. Review of the MIP images confirms the above findings IMPRESSION: 1. Right M1 MCA occlusion. 2. Right A2 ACA occlusion. 3. Only very minimal collateral supply with prominent lack of cortical enhancement throughout much of the right cerebral hemisphere. A CT perfusion study is pending. 4. 65% proximal left common carotid artery stenosis. 5. No significant internal carotid artery stenosis. 6. Mild right vertebral artery stenosis at its origin. 7. 3 mm left supraclinoid ICA aneurysm. Critical Value/emergent results were called by telephone at the time of interpretation on 10/28/2016 at 4:09 pm to Dr. Ritta Slot , who verbally acknowledged these results. Electronically Signed   By: Sebastian Ache M.D.   On: 10/20/2016 16:47   Ct Cerebral Perfusion W Contrast  Result Date: 10/18/2016 CLINICAL DATA:  Code stroke.  Left-sided weakness. EXAM: CT PERFUSION BRAIN TECHNIQUE: Multiphase CT imaging of the brain was performed following IV bolus contrast injection. Subsequent parametric perfusion maps were calculated using RAPID software. CONTRAST:  40 cc Isovue 370 intravenous COMPARISON:  CT and CTA from earlier today. FINDINGS: CT Brain Perfusion Findings: CBF (<30%) Volume: . Some of this volume includes the prior branch infarcts which occurred in September and since September, but there is still large completed infarct ridging these areas of chronic ischemia. Perfusion (Tmax>6.0s) volume: Mismatch  Volume: Infarction Location:Right MCA territory and posterior watershed. These results were called by telephone at the time of interpretation on 10/02/2016 at 4:31 pm to Dr. Ritta Slot , who verbally acknowledged these results. IMPRESSION: 1. Ischemia throughout the right MCA and distal ACA territory. 2. Infarct core measures 109 cc. Although some volume of this includes remote infarcts seen on previous imaging, the patient's acute infarct is still extensive. Electronically Signed   By: Marnee Spring M.D.   On: 10/02/2016 16:39   Ct Head Code Stroke W/o Cm  Result Date: 10/06/2016 CLINICAL DATA:  Code stroke.  Left-sided weakness EXAM: CT HEAD WITHOUT CONTRAST TECHNIQUE: Contiguous axial images were obtained from the base of the skull through the vertex without intravenous contrast. COMPARISON:  04/01/2016 FINDINGS: Brain: Remote cortically based infarcts in the right parietal and superior temporal lobes, which were acute on prior study. There has been an interval but chronic appearing infarct in the posterior right frontal cortex. Small remote left cerebellar infarct. No acute infarct noted. Vascular: Dense MCA on the right.  Atherosclerosis. Skull:  No acute finding Sinuses/Orbits: No acute finding Other: Critical Value/emergent results were called by telephone at the time of interpretation on 2016-11-23 at 3:54 pm to Dr. Amada Jupiter, who verbally acknowledged these results. ASPECTS ([place]berta Stroke Program Early CT Score) Not scored given the number of remote infarcts in territory of interest. IMPRESSION: 1. Dense right MCA. 2. Remote infarcts in the right MCA territory, which have increased in number since September 2017. 3. No acute hemorrhage. Electronically Signed   By: Marnee Spring M.D.   On: 11-23-2016 15:56    Lab Data:  CBC:  Recent Labs Lab Nov 23, 2016 1540 11/23/2016 1550  WBC 10.4  --   NEUTROABS 5.7  --   HGB 14.8 15.3*  HCT 44.8 45.0  MCV 87.5  --   PLT 223  --     Basic Metabolic Panel:  Recent Labs Lab 2016/11/23 1540 23-Nov-2016 1550  NA 140 140  K 3.9 4.1  CL 105 105  CO2 24  --   GLUCOSE 141* 145*  BUN 20 26*  CREATININE 1.03* 1.00  CALCIUM 9.5  --    GFR: CrCl cannot be calculated (Unknown ideal weight.). Liver Function Tests:  Recent Labs Lab 23-Nov-2016 1540  AST 25  ALT 20  ALKPHOS 123  BILITOT 0.9  PROT 6.8  ALBUMIN 4.4   No results for input(s): LIPASE, AMYLASE in the last 168 hours. No results for input(s): AMMONIA in the last 168 hours. Coagulation Profile:  Recent Labs Lab 11-23-16 1540  INR 1.13   Cardiac Enzymes: No results for input(s): CKTOTAL, CKMB, CKMBINDEX, TROPONINI in the last 168 hours. BNP (last 3 results) No results for input(s): PROBNP in the last 8760 hours. HbA1C: No results for input(s): HGBA1C in the last 72 hours. CBG:  Recent Labs Lab Nov 23, 2016 1542 11/23/16 1611  GLUCAP 139* 145*   Lipid Profile: No results for input(s): CHOL, HDL, LDLCALC, TRIG, CHOLHDL, LDLDIRECT in the last 72 hours. Thyroid Function Tests: No results for input(s): TSH, T4TOTAL, FREET4, T3FREE, THYROIDAB in the last 72 hours. Anemia Panel: No results for input(s): VITAMINB12, FOLATE, FERRITIN, TIBC, IRON, RETICCTPCT in the last 72 hours. Urine analysis:    Component Value Date/Time   COLORURINE YELLOW (A) 10/23/2015 1845   APPEARANCEUR HAZY (A) 10/23/2015 1845   APPEARANCEUR Clear 08/25/2013 0615   LABSPEC 1.012 10/23/2015 1845   LABSPEC 1.002 08/25/2013 0615   PHURINE 5.0 10/23/2015 1845   GLUCOSEU >500 (A) 10/23/2015 1845   GLUCOSEU Negative 08/25/2013 0615   HGBUR NEGATIVE 10/23/2015 1845   BILIRUBINUR NEGATIVE 10/23/2015 1845   BILIRUBINUR Negative 08/25/2013 0615   KETONESUR NEGATIVE 10/23/2015 1845   PROTEINUR 100 (A) 10/23/2015 1845   NITRITE NEGATIVE 10/23/2015 1845   LEUKOCYTESUR 1+ (A) 10/23/2015 1845   LEUKOCYTESUR 1+ 08/25/2013 0615     Gerren Hoffmeier M.D. [facility] 10/27/2016,  11:40 AM  Pager: [phone] Between 7am to 7pm - call Pager - [phone]  After 7pm go to [email or web address] - password [email or web address]  Call night coverage person covering after 7pm

## 2016-10-27 NOTE — Progress Notes (Addendum)
Patient arrived to unit from ED. Urinary incontinence episode noted. Post incontinence care provided. Patient responsive to tactile and painful stimulus. Respirations even and unlabored, lungs clear. HRR. Family at bedside.

## 2016-10-27 NOTE — Consult Note (Signed)
   [facility] CM Inpatient Consult   10/27/2016  Zoe Hughes 1918-12-10 [id number]   Patient evaluated for [facility] Care Management with [employer] plan.  Chart reviewed and reveals patient is active with Palliative Care for terminal care. No needs noted per progress notes for this 83 y.o. female  with past medical history of atrial fibrillation (on eliquis), HTN, GERD, anxiety admitted on 10/23/2016 with weakness and facial droop on the left side. Workup revealed acute R MCA infarct with ischemia through the R MCA and distal ACA territory. No needs determined. For questions, please contact:  Charlesetta Shanks, RN BSN CCM [facility]  [phone] business mobile phone Toll free office [phone]

## 2016-10-27 NOTE — Progress Notes (Signed)
Emesis episode noted. Patient minimally responsive to painful and tactile stimuli. Rhonchi throughout bilateral lung fields. HR irregular. Family Trina Ao) notified.

## 2016-10-27 NOTE — Consult Note (Signed)
Consultation Note Date: 10/27/2016   Patient Name: Zoe Hughes  DOB: 06-14-1919  MRN: [id number]  Age / Sex: 83 y.o., female  PCP: Mick Sell, MD Referring Physician: Cathren Harsh, MD  Reason for Consultation: Terminal Care  HPI/Patient Profile: 83 y.o. female  with past medical history of atrial fibrillation (on eliquis), HTN, GERD, anxiety admitted on 10/28/2016 with weakness and facial droop on the left side. Workup revealed acute R MCA infarct with ischemia through the R MCA and distal ACA territory. Patient was not a TPA candidate due to being on eEiquis. Patient's son and family discussed with neurology and ED MD and decisions were made that patient would desire comfort care vs other aggressive measures.    Clinical Assessment and Goals of Care: Patient in bed. Actively dying. Appears comfortable. Son and daughter in law at bedside. She has nasal cannula O2 at 2L. Per family she had no O2 requirements prior to admission.  Patient was in exercise class just prior to this incident. She was very active and independent. She would not want to live incapacitated in any way. Family is in agreement and at peace with the fact that this is exactly how [name] would choose her end of life to be. Patient has already planned her funeral.     Primary Decision Maker NEXT OF KIN patient's children    SUMMARY OF RECOMMENDATIONS -Continue comfort measures as ordered -Anticipate hospital death -PMT will follow for terminal care and symptom management as needed -D/C O2 - this is not considered a comfort measure  Code Status/Advance Care Planning:  DNR  Palliative Prophylaxis:   Delirium Protocol and Frequent Pain Assessment  Additional Recommendations (Limitations, Scope, Preferences):  Full Comfort Care  Prognosis:    Hours - Days  Discharge Planning: Anticipated Hospital Death  Primary  Diagnoses: Present on Admission: . Acute CVA (cerebrovascular accident) (HCC) . Chronic atrial fibrillation (HCC)   I have reviewed the medical record, interviewed the patient and family, and examined the patient. The following aspects are pertinent.  Past Medical History:  Diagnosis Date  . A-fib (HCC)   . Hypertension   . Hypothyroid    Social History   Social History  . Marital status: Divorced    Spouse name: N/A  . Number of children: N/A  . Years of education: N/A   Social History Main Topics  . Smoking status: Never Smoker  . Smokeless tobacco: Never Used  . Alcohol use No  . Drug use: No  . Sexual activity: Not Asked   Other Topics Concern  . None   Social History Narrative  . None   No family history on file. Scheduled Meds: Continuous Infusions: . morphine 1 mg/hr (10/28/2016 1742)   PRN Meds:.antiseptic oral rinse, atropine, LORazepam **OR** LORazepam **OR** LORazepam, morphine, ondansetron **OR** ondansetron (ZOFRAN) IV Medications Prior to Admission:  Prior to Admission medications   Medication Sig Start Date End Date Taking? Authorizing Provider  ALPRAZolam (XANAX) 0.25 MG tablet Take 0.25 mg by mouth 2 (  two) times daily as needed for anxiety.   Yes Historical Provider, MD  aspirin 81 MG chewable tablet Chew 1 tablet (81 mg total) by mouth daily. 07/20/16  Yes Sital Mody, MD  Dextran 70-Hypromellose, PF, (ARTIFICIAL TEARS PF) 0.1-0.3 % SOLN Apply 1-2 drops to eye 2 (two) times daily as needed (for dry eyes).   Yes Historical Provider, MD  diltiazem (CARDIZEM CD) 300 MG 24 hr capsule Take 300 mg by mouth every morning.   Yes Historical Provider, MD  latanoprost (XALATAN) 0.005 % ophthalmic solution Place 1 drop into both eyes at bedtime.  08/13/15  Yes Historical Provider, MD  levothyroxine (SYNTHROID, LEVOTHROID) 88 MCG tablet Take 88 mcg by mouth daily before breakfast.   Yes Historical Provider, MD  pantoprazole (PROTONIX) 40 MG tablet Take 40 mg by mouth  daily.   Yes Historical Provider, MD  acetaminophen (TYLENOL) 325 MG tablet Take 325 mg by mouth every 6 (six) hours as needed.    Historical Provider, MD  apixaban (ELIQUIS) 2.5 MG TABS tablet Take 1 tablet (2.5 mg total) by mouth 2 (two) times daily. Patient not taking: Reported on 10/14/2016 04/04/16   Palma Holter, MD  clotrimazole-betamethasone (LOTRISONE) cream Apply 1 application topically 2 (two) times daily.    Historical Provider, MD  fluticasone (FLONASE) 50 MCG/ACT nasal spray Place 2 sprays into both nostrils daily.    Historical Provider, MD  furosemide (LASIX) 20 MG tablet Take 20 mg by mouth.    Historical Provider, MD  isosorbide mononitrate (IMDUR) 30 MG 24 hr tablet Take 1 tablet (30 mg total) by mouth daily. Patient not taking: Reported on 10/28/2016 07/20/16   Adrian Saran, MD  loratadine (CLARITIN) 10 MG tablet Take 10 mg by mouth daily.    Historical Provider, MD  pravastatin (PRAVACHOL) 40 MG tablet Take 1 tablet (40 mg total) by mouth daily at 6 PM. Patient not taking: Reported on 10/05/2016 04/02/16   Palma Holter, MD  traZODone (DESYREL) 50 MG tablet Take 50 mg by mouth at bedtime as needed for sleep.    Historical Provider, MD   Allergies  Allergen Reactions  . Clarithromycin Diarrhea  . Fluviral [Flu Virus Vaccine] Other (See Comments)    Patient develops the flu  . Aspirin Other (See Comments)    Blood Disorder- Thins blood too much when patient takes too much or high doses of aspirin  . Tetanus Toxoid Rash   Review of Systems  Unable to perform ROS: Patient unresponsive    Physical Exam  Constitutional:  frial, elderly  Cardiovascular:  Peripheral pulses weak, irregular rhythm  Pulmonary/Chest: Effort normal and breath sounds normal.  Neurological:  nonresponsive  Nursing note and vitals reviewed.   Vital Signs: BP (!) 136/95 (BP Location: Right Arm)   Pulse 70   Temp 97.7 F (36.5 C) (Axillary)   Resp 15   SpO2 (!) 81%  Pain  Assessment: CPOT       SpO2: SpO2: (!) 81 % O2 Device:SpO2: (!) 81 % O2 Flow Rate: .O2 Flow Rate (L/min): 2 L/min  IO: Intake/output summary:  Intake/Output Summary (Last 24 hours) at 10/27/16 1113 Last data filed at 10/27/16 1022  Gross per 24 hour  Intake                0 ml  Output                0 ml  Net  0 ml    LBM:   Baseline Weight:   Most recent weight:       Palliative Assessment/Data: PPS: 10%     Thank you for this consult. Palliative medicine will continue to follow and assist as needed.   Time In: 1015 Time Out: 1115 Time Total: 60 minutes Greater than 50%  of this time was spent counseling and coordinating care related to the above assessment and plan.  Signed by: Ocie Bob, AGNP-C Palliative Medicine    Please contact Palliative Medicine Team phone at [phone] for questions and concerns.  For individual provider: See Loretha Stapler

## 2016-10-27 NOTE — Progress Notes (Signed)
Patient scratching buttock/vaginal area and grabbing/pushing away staff hands with right arm/hand during morning bath. Patient muttering incomprehensible words during morning bath. Patient remains unable to follow commands, make eye contact.

## 2016-10-28 DIAGNOSIS — I482 Chronic atrial fibrillation: Secondary | ICD-10-CM

## 2016-10-28 MED ORDER — GLYCOPYRROLATE 0.2 MG/ML IJ SOLN: 0.2000 mg | INTRAMUSCULAR | Status: DC | PRN

## 2016-10-28 MED ORDER — GLYCOPYRROLATE 0.2 MG/ML IJ SOLN
0.2000 mg | INTRAMUSCULAR | Status: DC
Start: 2016-10-28 — End: 2016-10-29
  Administered 2016-10-28 – 2016-10-29 (×4): 0.2 mg via INTRAVENOUS
  Filled 2016-10-28 (×4): qty 1

## 2016-10-28 NOTE — Progress Notes (Signed)
[facility]                                                                              Patient Demographics  Zoe Hughes, is a 83 y.o. female, DOB - [birth year], WGN:[id number]  Admit date - 23-Nov-2016   Admitting Physician Jamarius Saha Jenna Luo, MD  Outpatient Primary MD for the patient is Mick Sell, MD  Outpatient specialists:   LOS - 2  days    Chief Complaint  Patient presents with  . Code Stroke       Brief summary   Patient is a 83 year old female with history of atrial fibrillation on eliquis, hypertension, hypothyroidism, GERD, anxiety who resides at independent living facility was initially in her normal state of health earlier this morning. History was obtained from her patient's family at the bedside and ER records. Around 2:50 PM, patient was eating lunch when she had acute onset of left-sided weakness weakness and facial droop on the left side. EMS was called and patient was brought to ED. Code stroke was called and patient was evaluated by neurology. She was not considered TPA candidate as patient is currently on anticoagulation, eliquis. CT showed right hyperdense MCA infarct and remote infarcts in the right MCA territory. CT perfusion imaging was done which showed ischemia throughout the right MCA and distal ACA territory. Patient's son arrived at the bedside who discussed in detail with neurology and with family's wishes the decision was made not to proceed with thrombectomy and family decided for comfort care.   Assessment & Plan   Principal Problem:   Acute CVA (cerebrovascular accident) [facility]) with underlying history of atrial fibrillation on anticoagulation - CT showed right hyperdense MCA infarct and remote infarcts in the right MCA territory. Not a TPA candidate secondary to anticoagulation, eliquis. Neurology was consulted. - CT perfusion imaging showed ischemia throughout the right MCA and distal ACA territory. Patient's son and other  family members discussed in detail with neurology and with family's wishes the decision was made not to proceed with thrombectomy and family decided for comfort care.  - Patient started on morphine drip, Ativan, sublingual atropine - NPO, palliative medicine following, appreciate recommendations  - Poor prognosis, expecting hospital death, actively dying, prognosis hours, family at the bedside   Active Problems:   Chronic atrial fibrillation (HCC) - hold chronic cardiac medications and anticoagulation    Code Status: DO NOT RESUSCITATE, comfort care DVT Prophylaxis:  Comfort care Family Communication: Discussed in detail with the patient, all imaging results, lab results explained to the patient's daughter-in-law at the bedside   Disposition Plan: Expecting hospital death  Time Spent in minutes   15 minutes  Procedures:  CT head CT perfusion study  Consultants:   Neurology Palliative medicine  Antimicrobials:      Medications  Scheduled Meds: . glycopyrrolate  0.2 mg Intravenous Q4H   Continuous Infusions: . morphine 2 mg/hr (10/27/16 1114)   PRN Meds:.antiseptic oral rinse, [DISCONTINUED] glycopyrrolate **OR** glycopyrrolate **OR** glycopyrrolate, [DISCONTINUED] haloperidol **OR** haloperidol **OR** haloperidol lactate, [DISCONTINUED] LORazepam **OR** LORazepam **OR** LORazepam, morphine, ondansetron **OR** ondansetron (ZOFRAN) IV   Antibiotics   Anti-infectives  None        Subjective:   Zoe Hughes was seen and examined today.Unresponsive on morphine drip, appears to be comfortable  Objective:   Vitals:   10/10/2016 1902 10/05/2016 2000 10/27/16 0500 10/28/16 0620  BP: (!) 188/94 (!) 170/82 (!) 136/95 (!) 88/44  Pulse: 100 99 70 (!) 106  Resp: Temp:  (!) 96.1 F (35.6 C) 97.7 F (36.5 C) (!) 102.7 F (39.3 C)  TempSrc:  Axillary Axillary Axillary  SpO2: 99% 99% (!) 81% (!) 67%    Intake/Output Summary (Last 24 hours) at  10/28/16 1305 Last data filed at 10/28/16 1610  Gross per 24 hour  Intake             36.9 ml  Output                0 ml  Net             36.9 ml     Wt Readings from Last 3 Encounters:  07/19/16 51.3 kg (113 lb)  03/31/16 51.3 kg (113 lb)  10/24/15 54.5 kg (120 lb 3.2 oz)     Exam  General: Unresponsive, on morphine drip, frail, eyes closed  HEENT:    Neck:   Cardiovascular: S1 S2 clear, regular rhythm  Respiratory: Coarse breath sounds  Gastrointestinal:   Ext: no edema  Neuro: unresponsive  Skin:   Psych: unresponsive   Data Reviewed:  I have personally reviewed following labs and imaging studies  Micro Results No results found for this or any previous visit (from the past 240 hour(s)).  Radiology Reports Ct Angio Head W Or Wo Contrast  Result Date: 10/08/2016 CLINICAL DATA:  Left-sided weakness. EXAM: CT ANGIOGRAPHY HEAD AND NECK TECHNIQUE: Multidetector CT imaging of the head and neck was performed using the standard protocol during bolus administration of intravenous contrast. Multiplanar CT image reconstructions and MIPs were obtained to evaluate the vascular anatomy. Carotid stenosis measurements (when applicable) are obtained utilizing NASCET criteria, using the distal internal carotid diameter as the denominator. CONTRAST:  50 mL Isovue 370 COMPARISON:  Noncontrast head CT earlier today. Head MRI/ MRA 04/01/2016. FINDINGS: CTA NECK FINDINGS Aortic arch: Standard 3 vessel aortic arch with relatively extensive, predominantly calcified atherosclerotic plaque. Prominent calcified plaque in the left subclavian artery without significant stenosis. Right carotid system: Mild atherosclerotic plaque in the proximal to mid common carotid artery without stenosis. Moderate calcified plaque at the carotid bifurcation and in the proximal ICA without significant ICA stenosis. Mild proximal ECA stenosis. Left carotid system: Calcified and soft plaque in the proximal common  carotid artery results in 65% stenosis. There is mild-to-moderate non stenotic plaque elsewhere throughout the common carotid artery. Moderately extensive calcified and soft plaque at the carotid bifurcation and in the proximal ICA without significant stenosis. Vertebral arteries: Patent with the right being mildly dominant. Mild right vertebral artery origin stenosis. Left vertebral artery compression at C5-6 due to degenerative vertebral spurring with moderate resultant stenosis. Skeleton: Advanced disc degeneration at C5-6 with asymmetric left-sided spurring resulting in left neural foraminal stenosis. Advanced multilevel facet arthrosis. Partially calcified pannus posterior to the dens with chronic lucency in the base of the dens likely related to the arthropathy. Other neck: No mass or lymph node enlargement. Upper chest: Mild ground-glass opacity and mild interlobular septal thickening in the lung apices which may reflect mild edema. 1.2 cm subpleural nodular opacity with associated calcification in the apical right upper lobe is unchanged  from a 07/20/2016 chest CT. Review of the MIP images confirms the above findings CTA HEAD FINDINGS Anterior circulation: The internal carotid arteries are patent from skullbase to carotid termini. There is mild bilateral siphon atherosclerosis without significant stenosis. There is proximal M1 occlusion on the right with only very minimal collateral supply to the anterior MCA territory. The right ACA is patent proximally but occludes at the distal A2 level. Left ACA and left MCA are patent without evidence of proximal branch occlusion or significant stenosis. 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 Posterior circulation: The intracranial vertebral arteries are widely patent to the basilar. PICA and SCA origins are patent. Right AICA origin is patent as well. Basilar artery is widely patent. There is a patent right posterior communicating  artery. PCAs are patent without evidence of significant stenosis. No aneurysm. Venous sinuses: Patent. Anatomic variants: None. Delayed phase: Not performed. CTA source images demonstrate prominent lack of cortical enhancement throughout the majority of the right MCA territory, also involving the basal ganglia and distal right ACA territory. Review of the MIP images confirms the above findings IMPRESSION: 1. Right M1 MCA occlusion. 2. Right A2 ACA occlusion. 3. Only very minimal collateral supply with prominent lack of cortical enhancement throughout much of the right cerebral hemisphere. A CT perfusion study is pending. 4. 65% proximal left common carotid artery stenosis. 5. No significant internal carotid artery stenosis. 6. Mild right vertebral artery stenosis at its origin. 7. 3 mm left supraclinoid ICA aneurysm. Critical Value/emergent results were called by telephone at the time of interpretation on 10/06/2016 at 4:09 pm to Dr. Ritta Slot , who verbally acknowledged these results. Electronically Signed   By: Sebastian Ache M.D.   On: 10/17/2016 16:47   Ct Angio Neck W Or Wo Contrast  Result Date: 10/15/2016 CLINICAL DATA:  Left-sided weakness. EXAM: CT ANGIOGRAPHY HEAD AND NECK TECHNIQUE: Multidetector CT imaging of the head and neck was performed using the standard protocol during bolus administration of intravenous contrast. Multiplanar CT image reconstructions and MIPs were obtained to evaluate the vascular anatomy. Carotid stenosis measurements (when applicable) are obtained utilizing NASCET criteria, using the distal internal carotid diameter as the denominator. CONTRAST:  50 mL Isovue 370 COMPARISON:  Noncontrast head CT earlier today. Head MRI/ MRA 04/01/2016. FINDINGS: CTA NECK FINDINGS Aortic arch: Standard 3 vessel aortic arch with relatively extensive, predominantly calcified atherosclerotic plaque. Prominent calcified plaque in the left subclavian artery without significant stenosis.  Right carotid system: Mild atherosclerotic plaque in the proximal to mid common carotid artery without stenosis. Moderate calcified plaque at the carotid bifurcation and in the proximal ICA without significant ICA stenosis. Mild proximal ECA stenosis. Left carotid system: Calcified and soft plaque in the proximal common carotid artery results in 65% stenosis. There is mild-to-moderate non stenotic plaque elsewhere throughout the common carotid artery. Moderately extensive calcified and soft plaque at the carotid bifurcation and in the proximal ICA without significant stenosis. Vertebral arteries: Patent with the right being mildly dominant. Mild right vertebral artery origin stenosis. Left vertebral artery compression at C5-6 due to degenerative vertebral spurring with moderate resultant stenosis. Skeleton: Advanced disc degeneration at C5-6 with asymmetric left-sided spurring resulting in left neural foraminal stenosis. Advanced multilevel facet arthrosis. Partially calcified pannus posterior to the dens with chronic lucency in the base of the dens likely related to the arthropathy. Other neck: No mass or lymph node enlargement. Upper chest: Mild ground-glass opacity and mild interlobular septal thickening in the  lung apices which may reflect mild edema. 1.2 cm subpleural nodular opacity with associated calcification in the apical right upper lobe is unchanged from a 07/20/2016 chest CT. Review of the MIP images confirms the above findings CTA HEAD FINDINGS Anterior circulation: The internal carotid arteries are patent from skullbase to carotid termini. There is mild bilateral siphon atherosclerosis without significant stenosis. There is proximal M1 occlusion on the right with only very minimal collateral supply to the anterior MCA territory. The right ACA is patent proximally but occludes at the distal A2 level. Left ACA and left MCA are patent without evidence of proximal branch occlusion or significant stenosis.  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 Posterior circulation: The intracranial vertebral arteries are widely patent to the basilar. PICA and SCA origins are patent. Right AICA origin is patent as well. Basilar artery is widely patent. There is a patent right posterior communicating artery. PCAs are patent without evidence of significant stenosis. No aneurysm. Venous sinuses: Patent. Anatomic variants: None. Delayed phase: Not performed. CTA source images demonstrate prominent lack of cortical enhancement throughout the majority of the right MCA territory, also involving the basal ganglia and distal right ACA territory. Review of the MIP images confirms the above findings IMPRESSION: 1. Right M1 MCA occlusion. 2. Right A2 ACA occlusion. 3. Only very minimal collateral supply with prominent lack of cortical enhancement throughout much of the right cerebral hemisphere. A CT perfusion study is pending. 4. 65% proximal left common carotid artery stenosis. 5. No significant internal carotid artery stenosis. 6. Mild right vertebral artery stenosis at its origin. 7. 3 mm left supraclinoid ICA aneurysm. Critical Value/emergent results were called by telephone at the time of interpretation on Nov 12, 2016 at 4:09 pm to Dr. Ritta Slot , who verbally acknowledged these results. Electronically Signed   By: Sebastian Ache M.D.   On: 12-Nov-2016 16:47   Ct Cerebral Perfusion W Contrast  Result Date: 11/12/16 CLINICAL DATA:  Code stroke.  Left-sided weakness. EXAM: CT PERFUSION BRAIN TECHNIQUE: Multiphase CT imaging of the brain was performed following IV bolus contrast injection. Subsequent parametric perfusion maps were calculated using RAPID software. CONTRAST:  40 cc Isovue 370 intravenous COMPARISON:  CT and CTA from earlier today. FINDINGS: CT Brain Perfusion Findings: CBF (<30%) Volume: . Some of this volume includes the prior branch infarcts which occurred in September and  since September, but there is still large completed infarct ridging these areas of chronic ischemia. Perfusion (Tmax>6.0s) volume: Mismatch Volume: Infarction Location:Right MCA territory and posterior watershed. These results were called by telephone at the time of interpretation on 12-Nov-2016 at 4:31 pm to Dr. Ritta Slot , who verbally acknowledged these results. IMPRESSION: 1. Ischemia throughout the right MCA and distal ACA territory. 2. Infarct core measures 109 cc. Although some volume of this includes remote infarcts seen on previous imaging, the patient's acute infarct is still extensive. Electronically Signed   By: Marnee Spring M.D.   On: Nov 12, 2016 16:39   Ct Head Code Stroke W/o Cm  Result Date: 11-12-2016 CLINICAL DATA:  Code stroke.  Left-sided weakness EXAM: CT HEAD WITHOUT CONTRAST TECHNIQUE: Contiguous axial images were obtained from the base of the skull through the vertex without intravenous contrast. COMPARISON:  04/01/2016 FINDINGS: Brain: Remote cortically based infarcts in the right parietal and superior temporal lobes, which were acute on prior study. There has been an interval but chronic appearing infarct in the posterior right frontal cortex. Small remote left cerebellar  infarct. No acute infarct noted. Vascular: Dense MCA on the right.  Atherosclerosis. Skull: No acute finding Sinuses/Orbits: No acute finding Other: Critical Value/emergent results were called by telephone at the time of interpretation on 10/04/2016 at 3:54 pm to Dr. Amada Jupiter, who verbally acknowledged these results. ASPECTS (Alberta Stroke Program Early CT Score) Not scored given the number of remote infarcts in territory of interest. IMPRESSION: 1. Dense right MCA. 2. Remote infarcts in the right MCA territory, which have increased in number since September 2017. 3. No acute hemorrhage. Electronically Signed   By: Marnee Spring M.D.   On: 10/20/2016 15:56    Lab Data:  CBC:  Recent  Labs Lab 10/19/2016 1540 10/24/2016 1550  WBC 10.4  --   NEUTROABS 5.7  --   HGB 14.8 15.3*  HCT 44.8 45.0  MCV 87.5  --   PLT 223  --    Basic Metabolic Panel:  Recent Labs Lab 10/25/2016 1540 10/24/2016 1550  NA 140 140  K 3.9 4.1  CL 105 105  CO2 24  --   GLUCOSE 141* 145*  BUN 20 26*  CREATININE 1.03* 1.00  CALCIUM 9.5  --    GFR: CrCl cannot be calculated (Unknown ideal weight.). Liver Function Tests:  Recent Labs Lab 10/18/2016 1540  AST 25  ALT 20  ALKPHOS 123  BILITOT 0.9  PROT 6.8  ALBUMIN 4.4   No results for input(s): LIPASE, AMYLASE in the last 168 hours. No results for input(s): AMMONIA in the last 168 hours. Coagulation Profile:  Recent Labs Lab 10/19/2016 1540  INR 1.13   Cardiac Enzymes: No results for input(s): CKTOTAL, CKMB, CKMBINDEX, TROPONINI in the last 168 hours. BNP (last 3 results) No results for input(s): PROBNP in the last 8760 hours. HbA1C: No results for input(s): HGBA1C in the last 72 hours. CBG:  Recent Labs Lab 10/28/2016 1542 10/20/2016 1611  GLUCAP 139* 145*   Lipid Profile: No results for input(s): CHOL, HDL, LDLCALC, TRIG, CHOLHDL, LDLDIRECT in the last 72 hours. Thyroid Function Tests: No results for input(s): TSH, T4TOTAL, FREET4, T3FREE, THYROIDAB in the last 72 hours. Anemia Panel: No results for input(s): VITAMINB12, FOLATE, FERRITIN, TIBC, IRON, RETICCTPCT in the last 72 hours. Urine analysis:    Component Value Date/Time   COLORURINE YELLOW (A) 10/23/2015 1845   APPEARANCEUR HAZY (A) 10/23/2015 1845   APPEARANCEUR Clear 08/25/2013 0615   LABSPEC 1.012 10/23/2015 1845   LABSPEC 1.002 08/25/2013 0615   PHURINE 5.0 10/23/2015 1845   GLUCOSEU >500 (A) 10/23/2015 1845   GLUCOSEU Negative 08/25/2013 0615   HGBUR NEGATIVE 10/23/2015 1845   BILIRUBINUR NEGATIVE 10/23/2015 1845   BILIRUBINUR Negative 08/25/2013 0615   KETONESUR NEGATIVE 10/23/2015 1845   PROTEINUR 100 (A) 10/23/2015 1845   NITRITE NEGATIVE  10/23/2015 1845   LEUKOCYTESUR 1+ (A) 10/23/2015 1845   LEUKOCYTESUR 1+ 08/25/2013 0615     Zoe Hughes M.D. [facility] 10/28/2016, 1:05 PM  Pager: [phone] Between 7am to 7pm - call Pager - [phone]  After 7pm go to [email or web address] - password [email or web address]  Call night coverage person covering after 7pm

## 2016-10-28 NOTE — Progress Notes (Signed)
Daily Progress Note   Patient Name: Zoe Hughes       Date: 10/28/2016 DOB: Oct 01, 1918  Age: 83 y.o. MRN#: [id number] Attending Physician: Cathren Harsh, MD Primary Care Physician: Mick Sell, MD Admit Date: 11/16/16  Reason for Consultation/Follow-up: Non pain symptom management, Pain control, Psychosocial/spiritual support and Terminal Care  Subjective: Patient is unresponsive. She is actively dying. Per chart review, she had copious secretions through the night and received Robinul, atropine every 2 hours. Daughter-in-law is at the bedside  Length of Stay: 2  Current Medications: Scheduled Meds:  . glycopyrrolate  0.2 mg Intravenous Q4H    Continuous Infusions: . morphine 2 mg/hr (10/27/16 1114)    PRN Meds: antiseptic oral rinse, [DISCONTINUED] glycopyrrolate **OR** glycopyrrolate **OR** glycopyrrolate, [DISCONTINUED] haloperidol **OR** haloperidol **OR** haloperidol lactate, [DISCONTINUED] LORazepam **OR** LORazepam **OR** LORazepam, morphine, ondansetron **OR** ondansetron (ZOFRAN) IV  Physical Exam  Constitutional:  Cachectic, elderly female; she is actively dying  HENT:  Head: Normocephalic and atraumatic.  Cardiovascular:  Very irregular  Pulmonary/Chest:  Respiratory pattern irregular No tachypnea Brief apnea  Neurological:  Unresponsive  Skin: Skin is warm. There is pallor.  No mottling  Psychiatric:  Unable to test  Nursing note and vitals reviewed.           Vital Signs: BP (!) 88/44 (BP Location: Right Arm)   Pulse (!) 106   Temp (!) 102.7 F (39.3 C) (Axillary)   Resp 14   SpO2 (!) 67%  SpO2: SpO2: (!) 67 % O2 Device: O2 Device: Not Delivered O2 Flow Rate: O2 Flow Rate (L/min): 2 L/min  Intake/output summary:  Intake/Output  Summary (Last 24 hours) at 10/28/16 0950 Last data filed at 10/28/16 9562  Gross per 24 hour  Intake             36.9 ml  Output                0 ml  Net             36.9 ml   LBM:   Baseline Weight:   Most recent weight:         Palliative Assessment/Data:      Patient Active Problem List   Diagnosis Date Noted  . Palliative care by specialist   . Goals of care, counseling/discussion   .  Terminal care   . Acute CVA (cerebrovascular accident) (HCC) 11/06/16  . CVA (cerebral vascular accident) (HCC) Nov 06, 2016  . Chest pain 07/19/2016  . Cerebrovascular accident (CVA) due to embolism of right middle cerebral artery (HCC)   . Dysarthria   . Cerebrovascular accident (CVA) (HCC)   . Chronic atrial fibrillation (HCC)   . HLD (hyperlipidemia)   . TIA (transient ischemic attack) 03/31/2016  . Persistent atrial fibrillation (HCC)   . Essential hypertension   . Thyroid activity decreased   . Pneumonia 10/23/2015  . Hyponatremia 08/22/2015    Palliative Care Assessment & Plan   Patient Profile:  83 y.o. female  with past medical history of atrial fibrillation (on eliquis), HTN, GERD, anxiety admitted on 2016-11-06 with weakness and facial droop on the left side. Workup revealed acute R MCA infarct with ischemia through the R MCA and distal ACA territory. Patient was not a TPA candidate due to being on eEiquis. Patient's son and family discussed with neurology and ED MD and decisions were made that patient would desire comfort care vs other aggressive measures  Assessment: Daughter-in-law prepared for death to be at any time. Her son is on the way to the hospital. They appear to be coping well. Chart reviewed and as noted above multiple as needed doses of Robinul and atropine needed for excess secretions. No as needed doses of morphine noted  Recommendations/Plan:  Secretions: We'll schedule Robinul 0.2 mg IV every 4 hours and every 4 hours as needed. We'll discontinue  atropine  Dyspnea/pain: Continue morphine infusion with weight unchanged at 2 mg an hour and 1 mg every 15  minutes as needed  Goals of Care and Additional Recommendations:  Limitations on Scope of Treatment: Full Comfort Care  Code Status:    Code Status Orders        Start     Ordered   10/27/16 1116  Do not attempt resuscitation (DNR)  Continuous    Question Answer Comment  In the event of cardiac or respiratory ARREST Do not call a "code blue"   In the event of cardiac or respiratory ARREST Do not perform Intubation, CPR, defibrillation or ACLS   In the event of cardiac or respiratory ARREST Use medication by any route, position, wound care, and other measures to relive pain and suffering. May use oxygen, suction and manual treatment of airway obstruction as needed for comfort.      10/27/16 1116    Code Status History    Date Active Date Inactive Code Status Order ID Comments User Context   2016/11/06  5:10 PM 10/27/2016 11:16 AM DNR [id number]  Alvira Monday, MD ED   07/19/2016 11:47 PM 07/20/2016  1:11 PM DNR [id number]  Adrian Saran, MD ED   07/19/2016 11:39 PM 07/19/2016 11:47 PM DNR [id number]  Adrian Saran, MD ED   03/31/2016  9:08 PM 04/03/2016  6:53 PM DNR [id number]  Arvilla Market, DO Inpatient   10/23/2015  7:38 PM 10/27/2015  5:20 PM Full Code [id number]  Auburn Bilberry, MD ED   08/22/2015  4:50 PM 08/25/2015  4:41 PM Full Code [id number]  Enedina Finner, MD Inpatient    Advance Directive Documentation     Most Recent Value  Type of Advance Directive  Out of facility DNR (pink MOST or yellow form)  Pre-existing out of facility DNR order (yellow form or pink MOST form)  -  "MOST" Form in Place?  -       Prognosis:   Hours -  Days  Discharge Planning:  Anticipated Hospital Death  Care plan was discussed with Dr. Neale Burly  Thank you for allowing the Palliative Medicine Team to assist in the care of this patient.   Time In: 0900 Time Out: 0935 Total Time 35 min  Prolonged Time Billed  no       Greater than 50%  of this time was spent counseling and coordinating care related to the above assessment and plan.  Irean Hong, NP  Please contact Palliative Medicine Team phone at [phone] for questions and concerns.

## 2016-10-31 NOTE — Progress Notes (Signed)
150cc IV morphine drip wasted. Witnessed by Leonie Man, RN.

## 2016-10-31 NOTE — Progress Notes (Signed)
Patient expired with family (son and daughter-in-law) at bedside. Death verified with second nurse, Leonie Man RN. MD (Opyd) notified via text page, who came to sign the death certificate. [facility] notified, stating patient was tissue donor.

## 2016-10-31 NOTE — Death Summary Note (Signed)
DEATH SUMMARY   Patient Details  Name: Zoe Hughes MRN: [id number] DOB: Nov 19, 1918  Admission/Discharge Information   Admit Date:  2016/11/05  Date of Death: Date of Death: Nov 08, 2016  Time of Death: Time of Death: 0150  Length of Stay: 3  Referring Physician: FITZGERALD, DAVID P, MD   Reason(s) for Hospitalization  Acute right hyperdense MCA CVA  Diagnoses  Preliminary cause of death: Acute CVA (cerebrovascular accident) (HCC) Secondary Diagnoses (including complications and co-morbidities):  Principal Problem:   Acute CVA (cerebrovascular accident) (HCC) Active Problems:   Chronic atrial fibrillation [facility])    Terminal care   Brief Hospital Course (including significant findings, care, treatment, and services provided and events leading to death)  Zoe Hughes is a 83 y.o. year old female who Patient is a 83 year old female with history of atrial fibrillation on eliquis,hypertension, hypothyroidism, GERD, anxiety who resides at independent living facility was initially in her normal state of health earlier this morning. History was obtained from her patient's family at the bedside and ER records. Around 2:50 PM, patient was eating lunch when she had acute onset of left-sided weakness weakness and facial droop on the left side. EMS was called and patient was brought to ED. Code stroke was called and patient was evaluated by neurology. She was not considered TPA candidate as patient is currently on anticoagulation, eliquis. CT showed right hyperdense MCA infarct and remote infarcts in the right MCA territory. CT perfusion imaging was done which showed ischemia throughout the right MCA and distal ACA territory. Patient's son arrived at the bedside who discussed in detail with neurology and with family's wishes the decision was made not to proceed with thrombectomy and family decided for comfort care.   Brief hospital course Acute CVA (cerebrovascular accident) (HCC)with  underlying history of atrial fibrillation on anticoagulation - CT showed right hyperdense MCA infarct and remote infarcts in the right MCA territory. Not a TPA candidate secondary to anticoagulation, eliquis. Neurology was consulted. - CT perfusion imaging showed ischemia throughout the right MCA and distal ACA territory. Patient's son and other family members discussed in detail with neurology and with family's wishes, the decision was made not to proceed with thrombectomy and family decided for complete comfort care.  - Patient was started on morphine drip, Ativan and sublingual atropine - Patient was placed on nothing by mouth status, palliative medicine was consulted for assistance with terminal care.  - Overall poor prognosis, patient passed on 11-08-16 at 1:50 am  Chronic atrial fibrillation (HCC) - all cardiac medications and anticoagulation were held due to comfort care status.    Pertinent Labs and Studies  Significant Diagnostic Studies Ct Angio Head W Or Wo Contrast  Result Date: Nov 05, 2016 CLINICAL DATA:  Left-sided weakness. EXAM: CT ANGIOGRAPHY HEAD AND NECK TECHNIQUE: Multidetector CT imaging of the head and neck was performed using the standard protocol during bolus administration of intravenous contrast. Multiplanar CT image reconstructions and MIPs were obtained to evaluate the vascular anatomy. Carotid stenosis measurements (when applicable) are obtained utilizing NASCET criteria, using the distal internal carotid diameter as the denominator. CONTRAST:  50 mL Isovue 370 COMPARISON:  Noncontrast head CT earlier today. Head MRI/ MRA 04/01/2016. FINDINGS: CTA NECK FINDINGS Aortic arch: Standard 3 vessel aortic arch with relatively extensive, predominantly calcified atherosclerotic plaque. Prominent calcified plaque in the left subclavian artery without significant stenosis. Right carotid system: Mild atherosclerotic plaque in the proximal to mid common carotid artery without  stenosis. Moderate calcified plaque at the carotid  bifurcation and in the proximal ICA without significant ICA stenosis. Mild proximal ECA stenosis. Left carotid system: Calcified and soft plaque in the proximal common carotid artery results in 65% stenosis. There is mild-to-moderate non stenotic plaque elsewhere throughout the common carotid artery. Moderately extensive calcified and soft plaque at the carotid bifurcation and in the proximal ICA without significant stenosis. Vertebral arteries: Patent with the right being mildly dominant. Mild right vertebral artery origin stenosis. Left vertebral artery compression at C5-6 due to degenerative vertebral spurring with moderate resultant stenosis. Skeleton: Advanced disc degeneration at C5-6 with asymmetric left-sided spurring resulting in left neural foraminal stenosis. Advanced multilevel facet arthrosis. Partially calcified pannus posterior to the dens with chronic lucency in the base of the dens likely related to the arthropathy. Other neck: No mass or lymph node enlargement. Upper chest: Mild ground-glass opacity and mild interlobular septal thickening in the lung apices which may reflect mild edema. 1.2 cm subpleural nodular opacity with associated calcification in the apical right upper lobe is unchanged from a 07/20/2016 chest CT. Review of the MIP images confirms the above findings CTA HEAD FINDINGS Anterior circulation: The internal carotid arteries are patent from skullbase to carotid termini. There is mild bilateral siphon atherosclerosis without significant stenosis. There is proximal M1 occlusion on the right with only very minimal collateral supply to the anterior MCA territory. The right ACA is patent proximally but occludes at the distal A2 level. Left ACA and left MCA are patent without evidence of proximal branch occlusion or significant stenosis. 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 Posterior circulation: The  intracranial vertebral arteries are widely patent to the basilar. PICA and SCA origins are patent. Right AICA origin is patent as well. Basilar artery is widely patent. There is a patent right posterior communicating artery. PCAs are patent without evidence of significant stenosis. No aneurysm. Venous sinuses: Patent. Anatomic variants: None. Delayed phase: Not performed. CTA source images demonstrate prominent lack of cortical enhancement throughout the majority of the right MCA territory, also involving the basal ganglia and distal right ACA territory. Review of the MIP images confirms the above findings IMPRESSION: 1. Right M1 MCA occlusion. 2. Right A2 ACA occlusion. 3. Only very minimal collateral supply with prominent lack of cortical enhancement throughout much of the right cerebral hemisphere. A CT perfusion study is pending. 4. 65% proximal left common carotid artery stenosis. 5. No significant internal carotid artery stenosis. 6. Mild right vertebral artery stenosis at its origin. 7. 3 mm left supraclinoid ICA aneurysm. Critical Value/emergent results were called by telephone at the time of interpretation on 11-11-2016 at 4:09 pm to Dr. Ritta Slot , who verbally acknowledged these results. Electronically Signed   By: Sebastian Ache M.D.   On: 2016-11-11 16:47   Ct Angio Neck W Or Wo Contrast  Result Date: Nov 11, 2016 CLINICAL DATA:  Left-sided weakness. EXAM: CT ANGIOGRAPHY HEAD AND NECK TECHNIQUE: Multidetector CT imaging of the head and neck was performed using the standard protocol during bolus administration of intravenous contrast. Multiplanar CT image reconstructions and MIPs were obtained to evaluate the vascular anatomy. Carotid stenosis measurements (when applicable) are obtained utilizing NASCET criteria, using the distal internal carotid diameter as the denominator. CONTRAST:  50 mL Isovue 370 COMPARISON:  Noncontrast head CT earlier today. Head MRI/ MRA 04/01/2016. FINDINGS: CTA NECK  FINDINGS Aortic arch: Standard 3 vessel aortic arch with relatively extensive, predominantly calcified atherosclerotic plaque. Prominent calcified plaque in the left subclavian artery without significant  stenosis. Right carotid system: Mild atherosclerotic plaque in the proximal to mid common carotid artery without stenosis. Moderate calcified plaque at the carotid bifurcation and in the proximal ICA without significant ICA stenosis. Mild proximal ECA stenosis. Left carotid system: Calcified and soft plaque in the proximal common carotid artery results in 65% stenosis. There is mild-to-moderate non stenotic plaque elsewhere throughout the common carotid artery. Moderately extensive calcified and soft plaque at the carotid bifurcation and in the proximal ICA without significant stenosis. Vertebral arteries: Patent with the right being mildly dominant. Mild right vertebral artery origin stenosis. Left vertebral artery compression at C5-6 due to degenerative vertebral spurring with moderate resultant stenosis. Skeleton: Advanced disc degeneration at C5-6 with asymmetric left-sided spurring resulting in left neural foraminal stenosis. Advanced multilevel facet arthrosis. Partially calcified pannus posterior to the dens with chronic lucency in the base of the dens likely related to the arthropathy. Other neck: No mass or lymph node enlargement. Upper chest: Mild ground-glass opacity and mild interlobular septal thickening in the lung apices which may reflect mild edema. 1.2 cm subpleural nodular opacity with associated calcification in the apical right upper lobe is unchanged from a 07/20/2016 chest CT. Review of the MIP images confirms the above findings CTA HEAD FINDINGS Anterior circulation: The internal carotid arteries are patent from skullbase to carotid termini. There is mild bilateral siphon atherosclerosis without significant stenosis. There is proximal M1 occlusion on the right with only very minimal collateral  supply to the anterior MCA territory. The right ACA is patent proximally but occludes at the distal A2 level. Left ACA and left MCA are patent without evidence of proximal branch occlusion or significant stenosis. 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 Posterior circulation: The intracranial vertebral arteries are widely patent to the basilar. PICA and SCA origins are patent. Right AICA origin is patent as well. Basilar artery is widely patent. There is a patent right posterior communicating artery. PCAs are patent without evidence of significant stenosis. No aneurysm. Venous sinuses: Patent. Anatomic variants: None. Delayed phase: Not performed. CTA source images demonstrate prominent lack of cortical enhancement throughout the majority of the right MCA territory, also involving the basal ganglia and distal right ACA territory. Review of the MIP images confirms the above findings IMPRESSION: 1. Right M1 MCA occlusion. 2. Right A2 ACA occlusion. 3. Only very minimal collateral supply with prominent lack of cortical enhancement throughout much of the right cerebral hemisphere. A CT perfusion study is pending. 4. 65% proximal left common carotid artery stenosis. 5. No significant internal carotid artery stenosis. 6. Mild right vertebral artery stenosis at its origin. 7. 3 mm left supraclinoid ICA aneurysm. Critical Value/emergent results were called by telephone at the time of interpretation on 11-17-16 at 4:09 pm to Dr. Ritta Slot , who verbally acknowledged these results. Electronically Signed   By: Sebastian Ache M.D.   On: Nov 17, 2016 16:47   Ct Cerebral Perfusion W Contrast  Result Date: Nov 17, 2016 CLINICAL DATA:  Code stroke.  Left-sided weakness. EXAM: CT PERFUSION BRAIN TECHNIQUE: Multiphase CT imaging of the brain was performed following IV bolus contrast injection. Subsequent parametric perfusion maps were calculated using RAPID software. CONTRAST:  40 cc  Isovue 370 intravenous COMPARISON:  CT and CTA from earlier today. FINDINGS: CT Brain Perfusion Findings: CBF (<30%) Volume: . Some of this volume includes the prior branch infarcts which occurred in September and since September, but there is still large completed infarct ridging these areas of chronic ischemia.  Perfusion (Tmax>6.0s) volume: Mismatch Volume: Infarction Location:Right MCA territory and posterior watershed. These results were called by telephone at the time of interpretation on 10/05/2016 at 4:31 pm to Dr. Ritta Slot , who verbally acknowledged these results. IMPRESSION: 1. Ischemia throughout the right MCA and distal ACA territory. 2. Infarct core measures 109 cc. Although some volume of this includes remote infarcts seen on previous imaging, the patient's acute infarct is still extensive. Electronically Signed   By: Marnee Spring M.D.   On: 10/11/2016 16:39   Ct Head Code Stroke W/o Cm  Result Date: 10/20/2016 CLINICAL DATA:  Code stroke.  Left-sided weakness EXAM: CT HEAD WITHOUT CONTRAST TECHNIQUE: Contiguous axial images were obtained from the base of the skull through the vertex without intravenous contrast. COMPARISON:  04/01/2016 FINDINGS: Brain: Remote cortically based infarcts in the right parietal and superior temporal lobes, which were acute on prior study. There has been an interval but chronic appearing infarct in the posterior right frontal cortex. Small remote left cerebellar infarct. No acute infarct noted. Vascular: Dense MCA on the right.  Atherosclerosis. Skull: No acute finding Sinuses/Orbits: No acute finding Other: Critical Value/emergent results were called by telephone at the time of interpretation on 10/12/2016 at 3:54 pm to Dr. Amada Jupiter, who verbally acknowledged these results. ASPECTS ([place] Stroke Program Early CT Score) Not scored given the number of remote infarcts in territory of interest. IMPRESSION: 1. Dense right MCA. 2. Remote  infarcts in the right MCA territory, which have increased in number since September 2017. 3. No acute hemorrhage. Electronically Signed   By: Marnee Spring M.D.   On: 10/04/2016 15:56    Microbiology No results found for this or any previous visit (from the past 240 hour(s)).  Lab Basic Metabolic Panel:  Recent Labs Lab 10/12/2016 1540 10/13/2016 1550  NA 140 140  K 3.9 4.1  CL 105 105  CO2 24  --   GLUCOSE 141* 145*  BUN 20 26*  CREATININE 1.03* 1.00  CALCIUM 9.5  --    Liver Function Tests:  Recent Labs Lab 10/23/2016 1540  AST 25  ALT 20  ALKPHOS 123  BILITOT 0.9  PROT 6.8  ALBUMIN 4.4   No results for input(s): LIPASE, AMYLASE in the last 168 hours. No results for input(s): AMMONIA in the last 168 hours. CBC:  Recent Labs Lab 10/02/2016 1540 10/22/2016 1550  WBC 10.4  --   NEUTROABS 5.7  --   HGB 14.8 15.3*  HCT 44.8 45.0  MCV 87.5  --   PLT 223  --    Cardiac Enzymes: No results for input(s): CKTOTAL, CKMB, CKMBINDEX, TROPONINI in the last 168 hours. Sepsis Labs:  Recent Labs Lab 10/11/2016 1540  WBC 10.4    Procedures/Operations  None    Summar Mcglothlin 11/16/2016, 6:18 AM

## 2016-10-31 DEATH — deceased

## 2017-07-10 IMAGING — CR DG CHEST 1V
1 series · 1 of 1 positions shown · non-contrast
Comparison: 10/09/2013

CLINICAL DATA: Shortness of breath, cough

EXAM:
CHEST 1 VIEW

[portable]
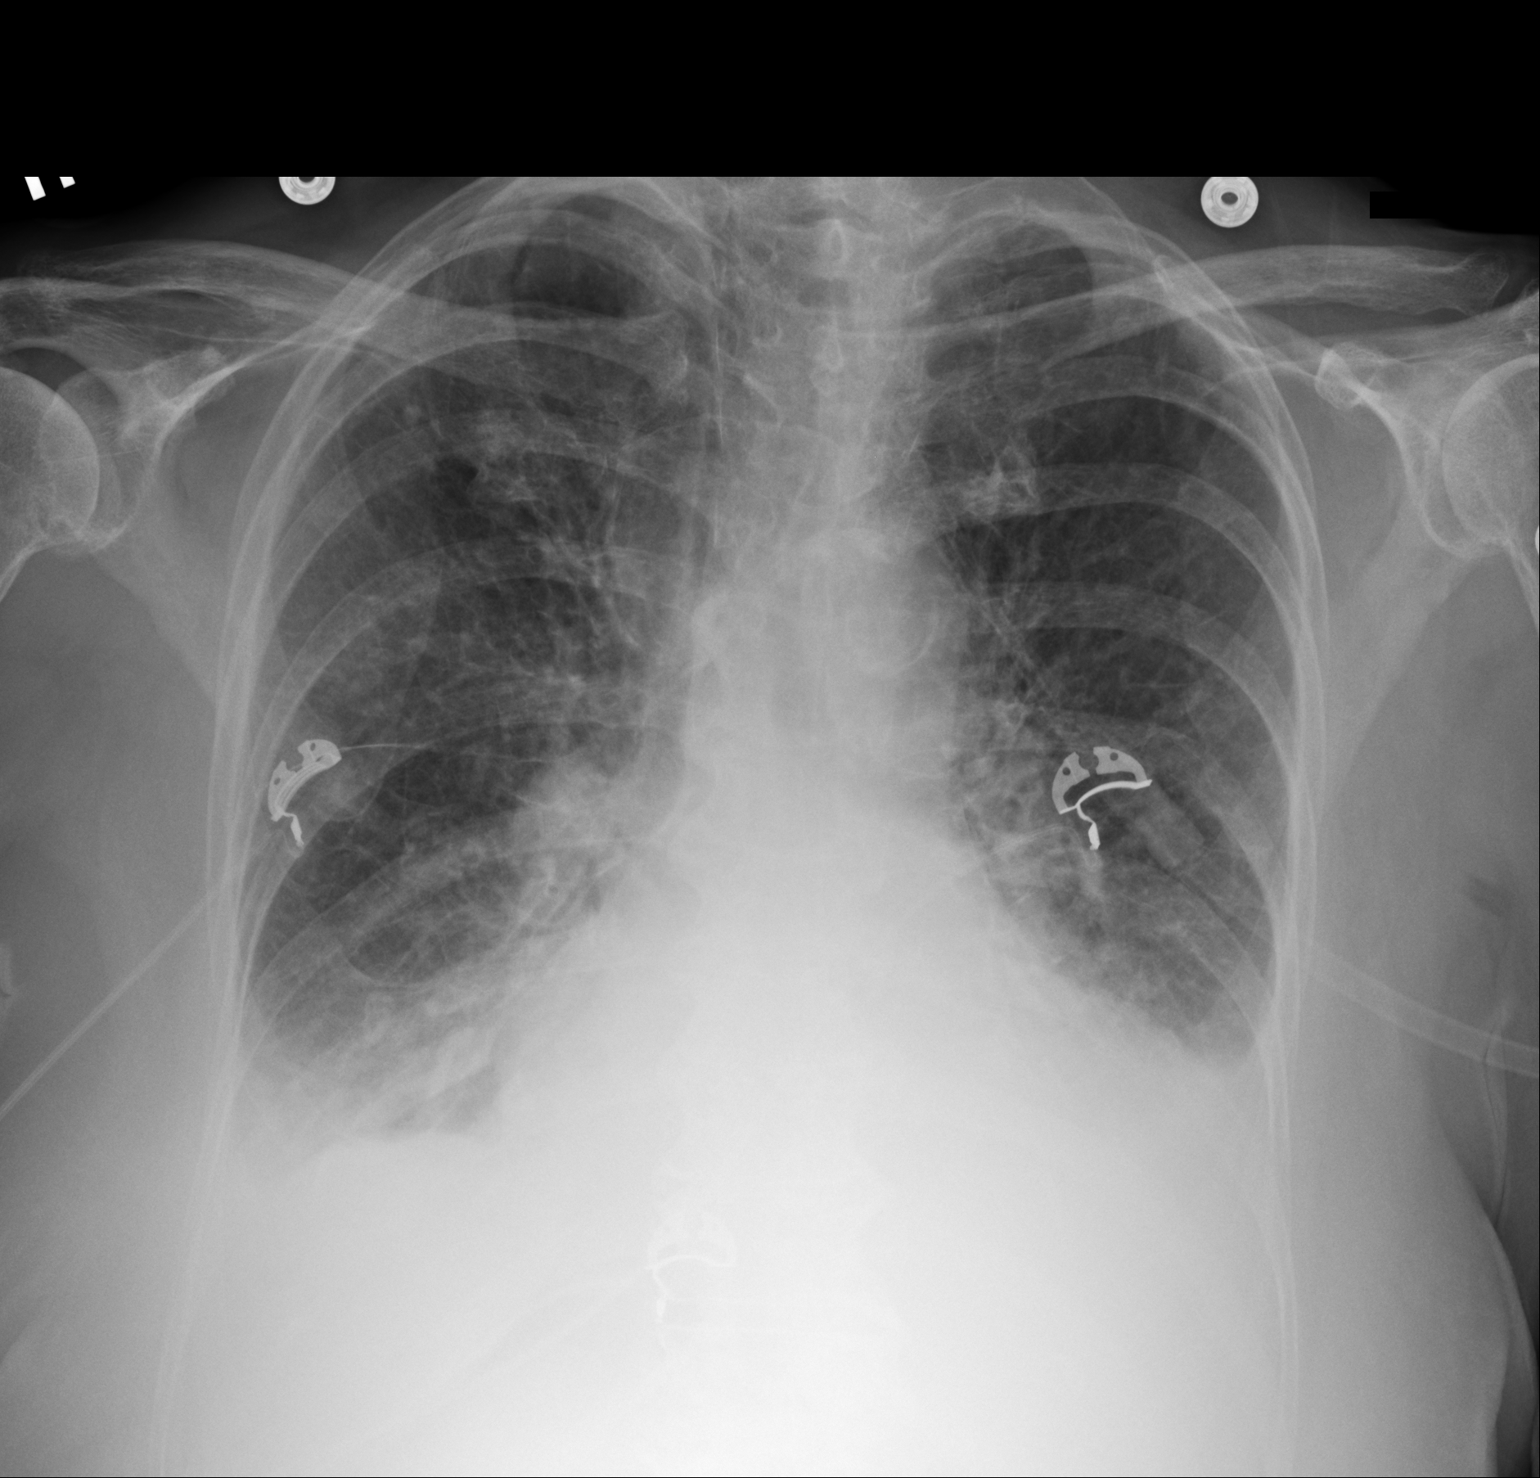

[1 of 1 positions shown; findings below may reference images not displayed]

FINDINGS: Cardiomegaly. Bilateral lower lobe airspace opacities are noted with
small bilateral effusions. Mild vascular congestion and interstitial
prominence. No acute bony abnormality. Hyperinflation of the lungs
compatible with COPD.
IMPRESSION: COPD, cardiomegaly.

Vascular congestion with interstitial prominence, likely
interstitial edema. Bibasilar opacities could reflect edema or
atelectasis. Small effusions.

## 2017-07-10 IMAGING — CR DG CHEST 1V PORT
1 series · 1 of 1 positions shown · non-contrast
Comparison: Chest radiograph performed earlier today at [DATE] p.m.

CLINICAL DATA: Acute onset of shortness of breath. Initial
encounter.

EXAM:
PORTABLE CHEST 1 VIEW

[ap]
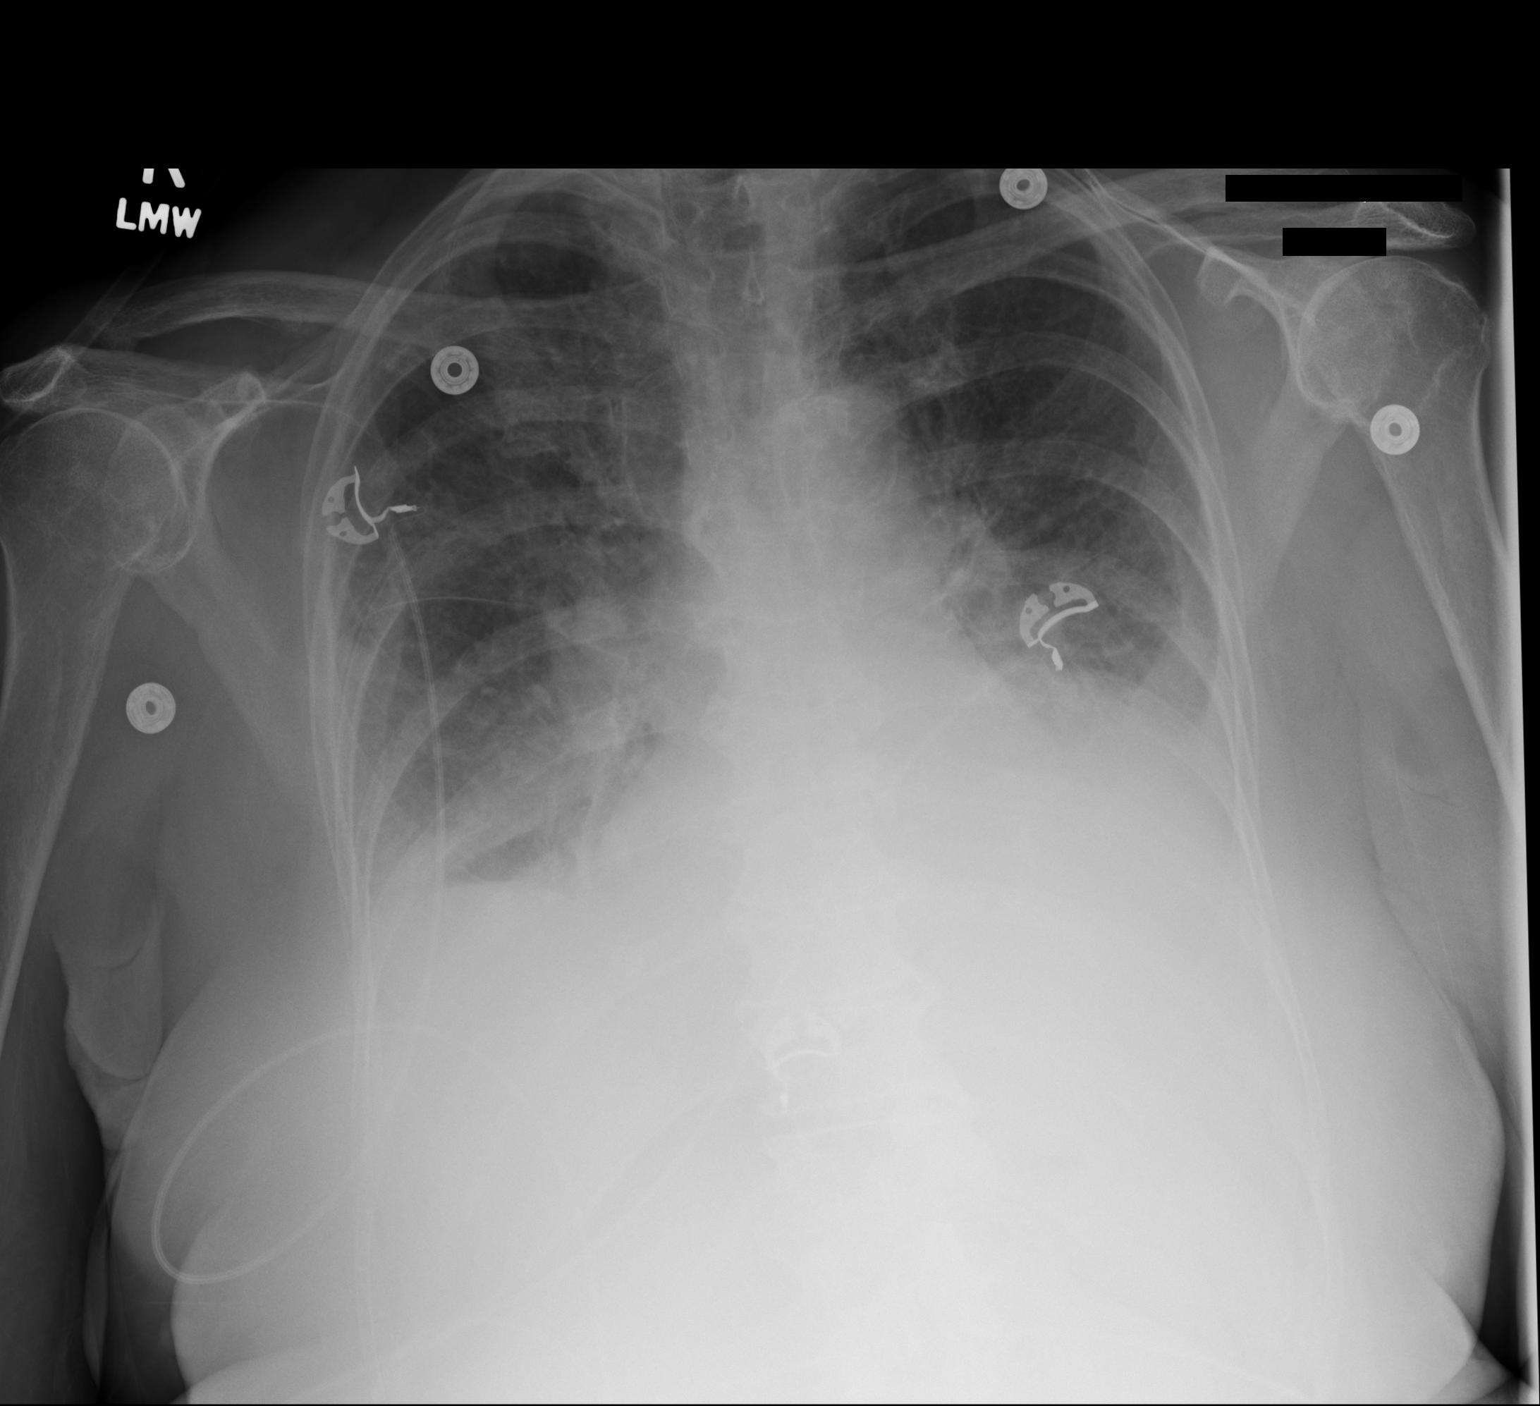

[1 of 1 positions shown; findings below may reference images not displayed]

FINDINGS: The lungs are well-aerated. Small bilateral pleural effusions are
noted. Bibasilar and bilateral central airspace opacities may
reflect mild pulmonary edema. No pneumothorax is seen.

The cardiomediastinal silhouette is mildly enlarged. No acute
osseous abnormalities are seen.
IMPRESSION: Small bilateral pleural effusions noted. Bibasilar and bilateral
central airspace opacities may reflect mild pulmonary edema. Mild
cardiomegaly noted.

## 2018-09-13 IMAGING — CT CT ANGIO HEAD
2 of 7 series · 8 of 33 positions shown · IV contrast (OMNI 350)
Comparison: Noncontrast head CT earlier today. Head MRI/ MRA
04/01/2016.

CLINICAL DATA: Left-sided weakness.

EXAM:
CT ANGIOGRAPHY HEAD AND NECK
TECHNIQUE: Multidetector CT imaging of the head and neck was performed using
the standard protocol during bolus administration of intravenous
contrast. Multiplanar CT image reconstructions and MIPs were
obtained to evaluate the vascular anatomy. Carotid stenosis
measurements (when applicable) are obtained utilizing NASCET
criteria, using the distal internal carotid diameter as the
denominator.
CONTRAST:  50 mL Isovue 370

[Series 5: cta neck · axial · 0.42mm/px · z∈[-221,-101]mm · 2 of 182 slices shown]
[im 61/182  soft-tissue]
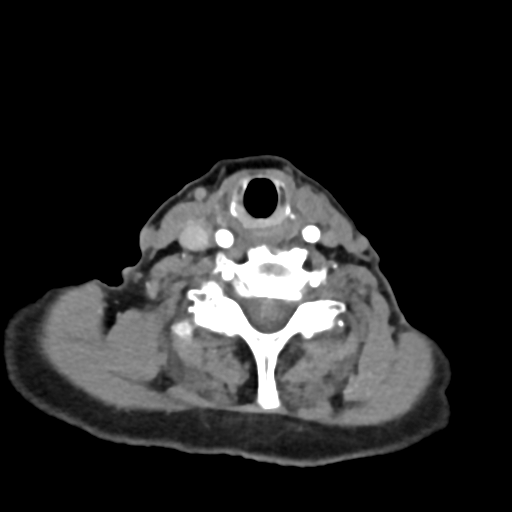
[im 121/182  soft-tissue]
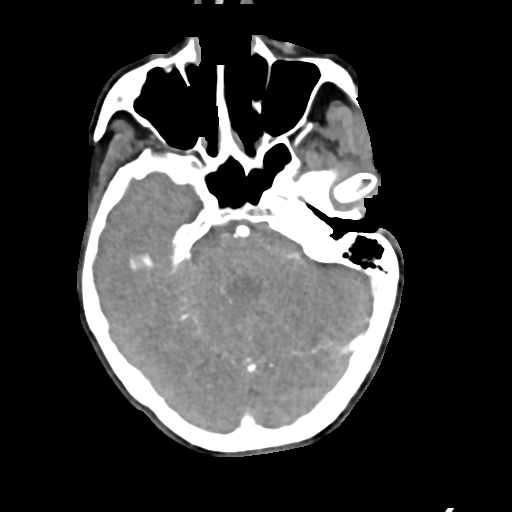

[Series 7: cta neck axial · axial · 0.39mm/px · z∈[-295,-37]mm · 6 of 363 slices shown]
[im 52/363  soft-tissue]
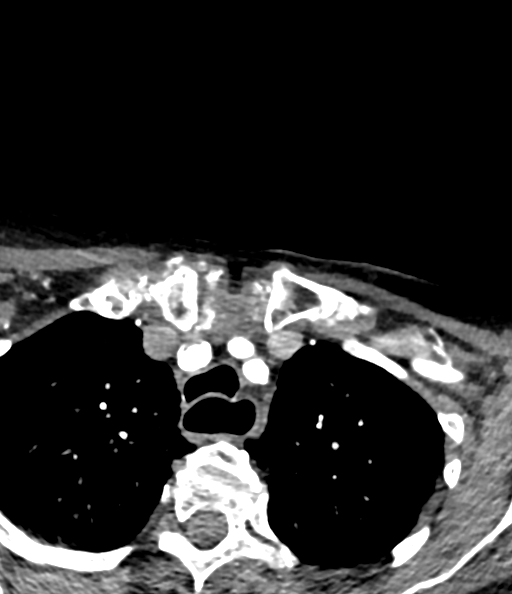
[im 104/363  bone]
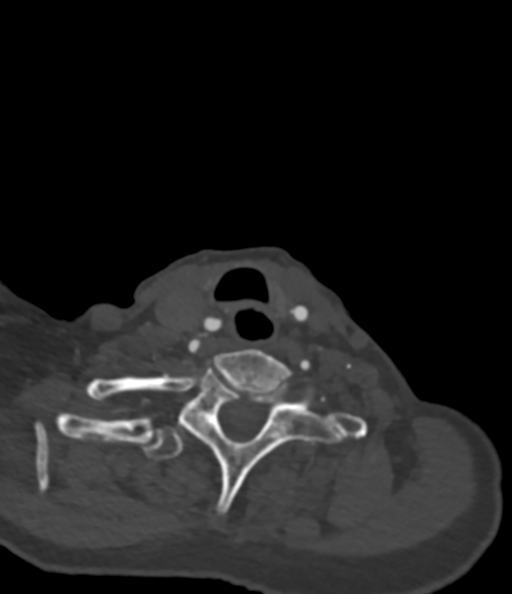
[im 156/363  soft-tissue]
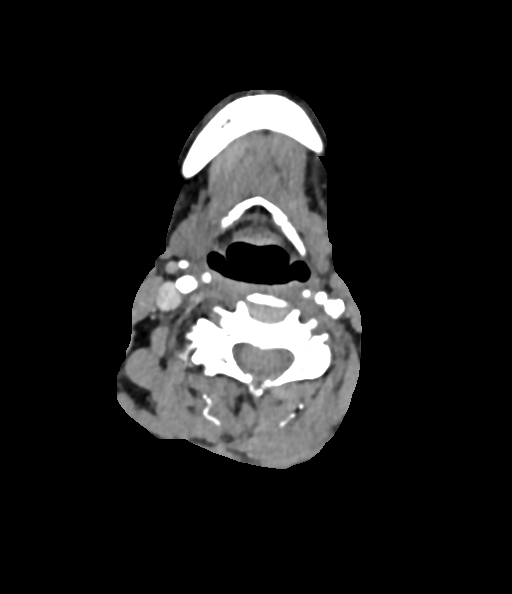
[im 207/363  bone]
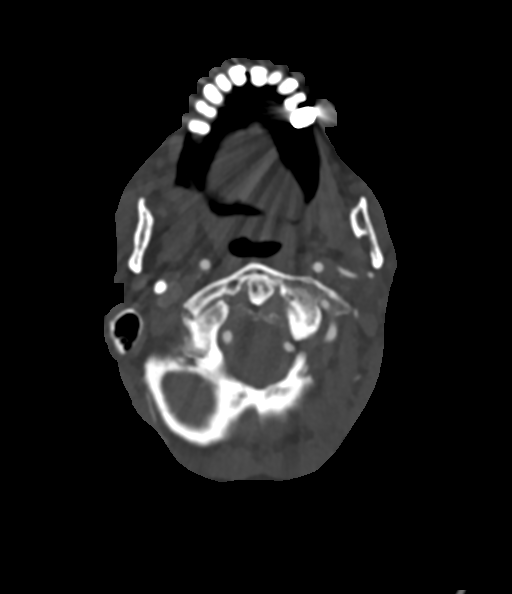
[im 259/363  soft-tissue]
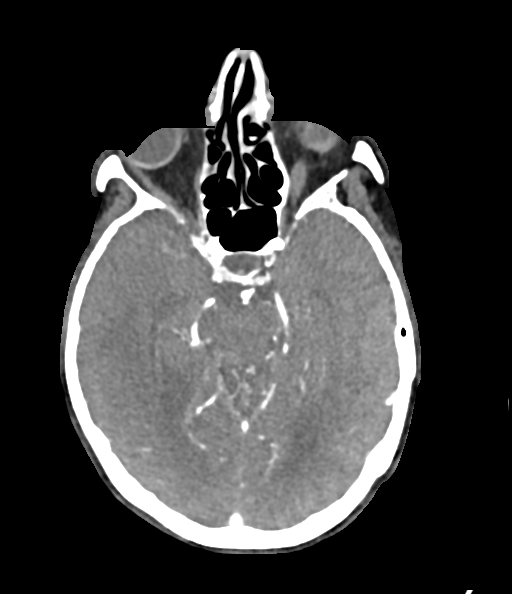
[im 311/363  bone]
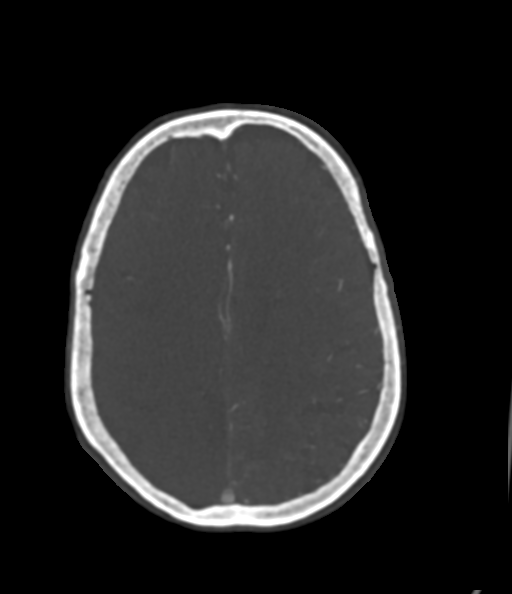

[8 of 33 positions shown; findings below may reference images not displayed]

FINDINGS: CTA NECK FINDINGS

Aortic arch: Standard 3 vessel aortic arch with relatively
extensive, predominantly calcified atherosclerotic plaque. Prominent
calcified plaque in the left subclavian artery without significant
stenosis.

Right carotid system: Mild atherosclerotic plaque in the proximal to
mid common carotid artery without stenosis. Moderate calcified
plaque at the carotid bifurcation and in the proximal ICA without
significant ICA stenosis. Mild proximal ECA stenosis.

Left carotid system: Calcified and soft plaque in the proximal
common carotid artery results in 65% stenosis. There is
mild-to-moderate non stenotic plaque elsewhere throughout the common
carotid artery. Moderately extensive calcified and soft plaque at
the carotid bifurcation and in the proximal ICA without significant
stenosis.

Vertebral arteries: Patent with the right being mildly dominant.
Mild right vertebral artery origin stenosis. Left vertebral artery
compression at C5-6 due to degenerative vertebral spurring with
moderate resultant stenosis.

Skeleton: Advanced disc degeneration at C5-6 with asymmetric
left-sided spurring resulting in left neural foraminal stenosis.
Advanced multilevel facet arthrosis. Partially calcified pannus
posterior to the dens with chronic lucency in the base of the dens
likely related to the arthropathy.

Other neck: No mass or lymph node enlargement.

Upper chest: Mild ground-glass opacity and mild interlobular septal
thickening in the lung apices which may reflect mild edema. 1.2 cm
subpleural nodular opacity with associated calcification in the
apical right upper lobe is unchanged from a 07/20/2016 chest CT.

Review of the MIP images confirms the above findings

CTA HEAD FINDINGS

Anterior circulation: The internal carotid arteries are patent from
skullbase to carotid termini. There is mild bilateral siphon
atherosclerosis without significant stenosis. There is proximal M1
occlusion on the right with only very minimal collateral supply to
the anterior MCA territory. The right ACA is patent proximally but
occludes at the distal A2 level. Left ACA and left MCA are patent
without evidence of proximal branch occlusion or significant
stenosis. 3 mm laterally directed aneurysm of the left supraclinoid
ICA is unchanged.

Posterior circulation: The intracranial vertebral arteries are
widely patent to the basilar. PICA and SCA origins are patent. Right
AICA origin is patent as well. Basilar artery is widely patent.
There is a patent right posterior communicating artery. PCAs are
patent without evidence of significant stenosis. No aneurysm.

Venous sinuses: Patent.

Anatomic variants: None.

Delayed phase: Not performed.

CTA source images demonstrate prominent lack of cortical enhancement
throughout the majority of the right MCA territory, also involving
the basal ganglia and distal right ACA territory.

Review of the MIP images confirms the above findings
IMPRESSION: 1. Right M1 MCA occlusion.
2. Right A2 ACA occlusion.
3. Only very minimal collateral supply with prominent lack of
cortical enhancement throughout much of the right cerebral
hemisphere. A CT perfusion study is pending.
4. 65% proximal left common carotid artery stenosis.
5. No significant internal carotid artery stenosis.
6. Mild right vertebral artery stenosis at its origin.
7. 3 mm left supraclinoid ICA aneurysm.

Critical Value/emergent results were called by telephone at the time
of interpretation on 10/26/2016 at [DATE] to Dr. MARIA ADDOLORATA RAUSEO
, who verbally acknowledged these results.
